# Patient Record
Sex: Female | Born: 1960 | Hispanic: Yes | Marital: Married | Smoking: Never smoker
Health system: Southern US, Community
[De-identification: ages and names within clinical notes are randomized; demographics above are authoritative.]

## PROBLEM LIST (undated history)

## (undated) DIAGNOSIS — E039 Hypothyroidism, unspecified: Secondary | ICD-10-CM

## (undated) DIAGNOSIS — T7840XA Allergy, unspecified, initial encounter: Secondary | ICD-10-CM

## (undated) DIAGNOSIS — N809 Endometriosis, unspecified: Secondary | ICD-10-CM

## (undated) DIAGNOSIS — E119 Type 2 diabetes mellitus without complications: Secondary | ICD-10-CM

## (undated) DIAGNOSIS — J45909 Unspecified asthma, uncomplicated: Secondary | ICD-10-CM

## (undated) HISTORY — DX: Unspecified asthma, uncomplicated: J45.909

## (undated) HISTORY — DX: Allergy, unspecified, initial encounter: T78.40XA

## (undated) HISTORY — DX: Endometriosis, unspecified: N80.9

## (undated) HISTORY — PX: TONSILLECTOMY: SUR1361

## (undated) HISTORY — DX: Type 2 diabetes mellitus without complications: E11.9

## (undated) HISTORY — PX: ABDOMINAL HYSTERECTOMY: SHX81

## (undated) HISTORY — DX: Hypothyroidism, unspecified: E03.9

---

## 2016-12-14 ENCOUNTER — Other Ambulatory Visit: Payer: Self-pay | Admitting: Otolaryngology

## 2016-12-14 DIAGNOSIS — H93A9 Pulsatile tinnitus, unspecified ear: Secondary | ICD-10-CM

## 2016-12-14 DIAGNOSIS — H9190 Unspecified hearing loss, unspecified ear: Secondary | ICD-10-CM

## 2016-12-25 ENCOUNTER — Ambulatory Visit
Admission: RE | Admit: 2016-12-25 | Discharge: 2016-12-25 | Disposition: A | Discharge status: Home / Self Care | Payer: Federal, State, Local not specified - PPO | Source: Ambulatory Visit | Attending: Otolaryngology | Admitting: Otolaryngology

## 2016-12-25 DIAGNOSIS — H93A9 Pulsatile tinnitus, unspecified ear: Secondary | ICD-10-CM

## 2016-12-25 DIAGNOSIS — H9190 Unspecified hearing loss, unspecified ear: Secondary | ICD-10-CM

## 2016-12-25 MED ORDER — GADOBENATE DIMEGLUMINE 529 MG/ML IV SOLN
10.0000 mL | Freq: Once | INTRAVENOUS | Status: AC | PRN
  Administered 2016-12-25: 10 mL via INTRAVENOUS

## 2017-03-02 ENCOUNTER — Encounter: Payer: Self-pay | Admitting: Physician Assistant

## 2017-03-02 ENCOUNTER — Ambulatory Visit: Payer: 59 | Admitting: Physician Assistant

## 2017-03-02 ENCOUNTER — Ambulatory Visit: Payer: Federal, State, Local not specified - PPO | Admitting: Emergency Medicine

## 2017-03-02 VITALS — BP 120/78 | HR 69 | Temp 98.0°F | Ht 62.0 in | Wt 134.8 lb

## 2017-03-02 DIAGNOSIS — J0140 Acute pansinusitis, unspecified: Secondary | ICD-10-CM | POA: Diagnosis not present

## 2017-03-02 MED ORDER — CETIRIZINE HCL 10 MG PO TABS: 10.0000 mg | ORAL_TABLET | Freq: Every day | ORAL | 11 refills | Status: DC

## 2017-03-02 MED ORDER — BENZONATATE 100 MG PO CAPS
100.0000 mg | ORAL_CAPSULE | Freq: Three times a day (TID) | ORAL | 0 refills | Status: DC | PRN
Start: 2017-03-02 — End: 2020-10-15

## 2017-03-02 MED ORDER — AMOXICILLIN-POT CLAVULANATE 875-125 MG PO TABS: 1.0000 | ORAL_TABLET | Freq: Two times a day (BID) | ORAL | 0 refills | Status: AC

## 2017-03-02 MED ORDER — FLUTICASONE PROPIONATE 50 MCG/ACT NA SUSP: 2.0000 | Freq: Every day | NASAL | 1 refills | Status: DC

## 2017-03-02 MED ORDER — GUAIFENESIN ER 1200 MG PO TB12: 1.0000 | ORAL_TABLET | Freq: Two times a day (BID) | ORAL | 1 refills | Status: DC | PRN

## 2017-03-02 NOTE — Progress Notes (Signed)
[facility] [address], [place] [phone]  Date:  03/02/2017   Name:  Karen Banks   DOB:  [birth year]   MRN:  [id number]  PCP:  Patient, No Pcp Per    History of Present Illness:  Karen Banks is a 57 y.o. female patient who presents to PCP with No chief complaint on file.    10 days of symptoms. Sore throat, then progressively worsened nasal congestion and runny nose. Cough that is non-productive worse at night. Subjective fever and chills.   Ear discomfort.   Chest pain from the coughing.   Non-smoker.  Headache.   Facial pain along her cheeks and forehead.   There are no active problems to display for this patient.   Past Medical History:  Diagnosis Date  . Allergy   . Asthma   . Diabetes mellitus without complication (HCC)     History reviewed. No pertinent surgical history.  Social History   Tobacco Use  . Smoking status: Never Smoker  . Smokeless tobacco: Never Used  Substance Use Topics  . Alcohol use: Not on file  . Drug use: Not on file    Family History  Problem Relation Age of Onset  . Cancer Father   . Hypertension Father   . Hyperlipidemia Father     Not on File  Medication list has been reviewed and updated.  No current outpatient medications on file prior to visit.   No current facility-administered medications on file prior to visit.     ROS ROS otherwise unremarkable unless listed above.  Physical Examination: BP 120/78 (BP Location: Left Arm, Patient Position: Sitting, Cuff Size: Normal)   Pulse 69   Temp 98 F (36.7 C) (Oral)   Ht 5\' 2"  (1.575 m)   Wt 134 lb 12.8 oz (61.1 kg)   SpO2 100%   BMI 24.66 kg/m  Ideal Body Weight: Weight in (lb) to have BMI = 25: 136.4  Physical Exam  Constitutional: She is oriented to person, place, and time. She appears well-developed and well-nourished. No distress.  HENT:  Head: Normocephalic and atraumatic.  Right Ear: Tympanic membrane, external  ear and ear canal normal.  Left Ear: Tympanic membrane, external ear and ear canal normal.  Nose: Mucosal edema and rhinorrhea present. Right sinus exhibits maxillary sinus tenderness. Right sinus exhibits no frontal sinus tenderness. Left sinus exhibits maxillary sinus tenderness. Left sinus exhibits no frontal sinus tenderness.  Mouth/Throat: No uvula swelling. No oropharyngeal exudate, posterior oropharyngeal edema or posterior oropharyngeal erythema.  Eyes: Conjunctivae and EOM are normal. Pupils are equal, round, and reactive to light.  Cardiovascular: Normal rate and regular rhythm. Exam reveals no gallop, no distant heart sounds and no friction rub.  No murmur heard. Pulmonary/Chest: Effort normal. No respiratory distress. She has no decreased breath sounds. She has no wheezes. She has no rhonchi.  Lymphadenopathy:       Head (right side): No submandibular, no tonsillar, no preauricular and no posterior auricular adenopathy present.       Head (left side): Preauricular adenopathy present. No submandibular, no tonsillar and no posterior auricular adenopathy present.    She has no cervical adenopathy.  Neurological: She is alert and oriented to person, place, and time.  Skin: She is not diaphoretic.  Psychiatric: She has a normal mood and affect. Her behavior is normal.     Assessment and Plan: Karen Banks is a 57 y.o. female who is here today for cc of congestion and  UR symptoms. Treating for sinusitis of bacterial etiology.   Acute non-recurrent pansinusitis - Plan: amoxicillin-clavulanate (AUGMENTIN) 875-125 MG tablet, Guaifenesin (MUCINEX MAXIMUM STRENGTH) 1200 MG TB12, fluticasone (FLONASE) 50 MCG/ACT nasal spray, cetirizine (ZYRTEC) 10 MG tablet, benzonatate (TESSALON) 100 MG capsule  Trena Platt, PA-C [facility] 1/9/20196:13 PM   There are no diagnoses linked to this encounter.  Trena Platt, PA-C [facility] 03/02/2017 6:11 PM

## 2017-03-02 NOTE — Patient Instructions (Addendum)
Please hydrate well with 64 oz of water if not more Please take medication as prescribed  Sinusitis, Adult Sinusitis is soreness and inflammation of your sinuses. Sinuses are hollow spaces in the bones around your face. They are located:  Around your eyes.  In the middle of your forehead.  Behind your nose.  In your cheekbones.  Your sinuses and nasal passages are lined with a stringy fluid (mucus). Mucus normally drains out of your sinuses. When your nasal tissues get inflamed or swollen, the mucus can get trapped or blocked so air cannot flow through your sinuses. This lets bacteria, viruses, and funguses grow, and that leads to infection. Follow these instructions at home: Medicines  Take, use, or apply over-the-counter and prescription medicines only as told by your doctor. These may include nasal sprays.  If you were prescribed an antibiotic medicine, take it as told by your doctor. Do not stop taking the antibiotic even if you start to feel better. Hydrate and Humidify  Drink enough water to keep your pee (urine) clear or pale yellow.  Use a cool mist humidifier to keep the humidity level in your home above 50%.  Breathe in steam for 10-15 minutes, 3-4 times a day or as told by your doctor. You can do this in the bathroom while a hot shower is running.  Try not to spend time in cool or dry air. Rest  Rest as much as possible.  Sleep with your head raised (elevated).  Make sure to get enough sleep each night. General instructions  Put a warm, moist washcloth on your face 3-4 times a day or as told by your doctor. This will help with discomfort.  Wash your hands often with soap and water. If there is no soap and water, use hand sanitizer.  Do not smoke. Avoid being around people who are smoking (secondhand smoke).  Keep all follow-up visits as told by your doctor. This is important. Contact a doctor if:  You have a fever.  Your symptoms get worse.  Your  symptoms do not get better within 10 days. Get help right away if:  You have a very bad headache.  You cannot stop throwing up (vomiting).  You have pain or swelling around your face or eyes.  You have trouble seeing.  You feel confused.  Your neck is stiff.  You have trouble breathing. This information is not intended to replace advice given to you by your health care provider. Make sure you discuss any questions you have with your health care provider. Document Released: 07/28/2007 Document Revised: 10/05/2015 Document Reviewed: 12/04/2014     IF you received an x-ray today, you will receive an invoice from [facility]. Please contact [facility] at [phone] with questions or concerns regarding your invoice.   IF you received labwork today, you will receive an invoice from [facility]. Please contact [facility] at [phone] with questions or concerns regarding your invoice.   Our billing staff will not be able to assist you with questions regarding bills from these companies.  You will be contacted with the lab results as soon as they are available. The fastest way to get your results is to activate your My Chart account. Instructions are located on the last page of this paperwork. If you have not heard from us regarding the results in 2 weeks, please contact this office.    [employer] Interactive Patient Education  [employer]2018 [employer]

## 2017-03-07 ENCOUNTER — Ambulatory Visit: Payer: Federal, State, Local not specified - PPO | Admitting: Neurology

## 2017-03-10 ENCOUNTER — Encounter: Payer: Self-pay | Admitting: Neurology

## 2017-03-10 ENCOUNTER — Ambulatory Visit: Payer: 59 | Admitting: Neurology

## 2017-03-10 VITALS — BP 130/79 | HR 71 | Ht 62.0 in | Wt 134.5 lb

## 2017-03-10 DIAGNOSIS — H9193 Unspecified hearing loss, bilateral: Secondary | ICD-10-CM | POA: Diagnosis not present

## 2017-03-10 NOTE — Progress Notes (Signed)
PATIENT: Karen Banks DOB: 07-May-1960  Chief Complaint  Patient presents with  . Left-sided Tinnitus    She is here with her husband, Donald Pore.  She underwent MRI brain and MRA head due to tinnitus.  Both scans were abnormal and she was referred here to further review results and next steps.  [place] four [place] ago.  . Otolaryngology    Suzanna Obey, MD     HISTORICAL  Karen Banks is a 57 year old female, seen in refer by primary care doctor Suzanna Obey, for evaluation of left side tinnitus, she is accompanied by her husband Donald Pore.  Initial evaluation is on Jan 17th 2019.  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  MRA of the brain showed normal variant, no large vessel disease.   REVIEW OF SYSTEMS: Full 14 system review of systems performed and notable only for hearing loss, ringing the ears, constipation, feeling cold, feeling hot, joint swelling, cramps, allergy, runny nose, frequent infections, memory loss, weakness, slurred speech  ALLERGIES: No Known Allergies  HOME MEDICATIONS: Current Outpatient Medications  Medication Sig Dispense Refill  . benzonatate (TESSALON) 100 MG capsule Take 1-2 capsules (100-200 mg total) by mouth 3 (three) times daily as needed for cough. 40 capsule 0  . cetirizine (ZYRTEC) 10 MG tablet Take 1 tablet (10 mg total) by mouth daily. 30 tablet 11  . fluticasone (FLONASE) 50 MCG/ACT nasal spray Place 2  sprays into both nostrils daily. 16 g 1  . Guaifenesin (MUCINEX MAXIMUM STRENGTH) 1200 MG TB12 Take 1 tablet (1,200 mg total) by mouth every 12 (twelve) hours as needed. 14 tablet 1  . levothyroxine (SYNTHROID) 150 MCG tablet TAKE 1 TABLET BY MOUTH EVERY MORNING ON AN EMPTY STOMACH    . NOVOLOG MIX 70/30 FLEXPEN (70-30) 100 UNIT/ML FlexPen INJECT 20 UNITS AT BREAKFAST AND 20 UNITS AT EVENING MEAL  6   No current facility-administered medications for this visit.     PAST MEDICAL HISTORY: Past Medical History:  Diagnosis Date  . Allergy   . Asthma   . Diabetes mellitus without complication (HCC)   . Endometriosis   . Hypothyroidism     PAST SURGICAL HISTORY: Past Surgical History:  Procedure Laterality Date  . ABDOMINAL HYSTERECTOMY    . CESAREAN SECTION      FAMILY HISTORY: Family History  Problem Relation Age of Onset  . Hypothyroidism Mother   . Hypertension Father   . Hyperlipidemia Father   . Colon cancer Father   . Diabetes Father   . Heart attack Maternal Grandfather   . Heart disease Maternal Grandfather   . Colon cancer Paternal Aunt   . Alzheimer's disease Maternal Aunt   . Alzheimer's disease Maternal Uncle     SOCIAL HISTORY:  Social History   Socioeconomic History  . Marital status: Married    Spouse name: Not on file  . Number of children: 1  . Years of education:  2 years college  . Highest education level: Not on file  Social  Needs  . Financial resource strain: Not on file  . Food insecurity - worry: Not on file  . Food insecurity - inability: Not on file  . Transportation needs - medical: Not on file  . Transportation needs - non-medical: Not on file  Occupational History  . Occupation: Diplomatic Services operational officersecretary  Tobacco Use  . Smoking status: Never Smoker  . Smokeless tobacco: Never Used  Substance and Sexual Activity  . Alcohol use: Yes    Comment: 2 glasses of wine per day  . Drug use: No  . Sexual activity: Not on file  Other Topics Concern  . Not  on file  Social History Narrative   Lives at home with husband.   Right-handed.   1 cup caffeine daily.     PHYSICAL EXAM   Vitals:   03/10/17 1412  BP: 130/79  Pulse: 71  Weight: 134 lb 8 oz (61 kg)  Height: 5\' 2"  (1.575 m)    Not recorded      Body mass index is 24.6 kg/m.  PHYSICAL EXAMNIATION:  Gen: NAD, conversant, well nourised, obese, well groomed                     Cardiovascular: Regular rate rhythm, no peripheral edema, warm, nontender. Eyes: Conjunctivae clear without exudates or hemorrhage Neck: Supple, no carotid bruits. Pulmonary: Clear to auscultation bilaterally   NEUROLOGICAL EXAM:  MENTAL STATUS: Speech:    Speech is normal; fluent and spontaneous with normal comprehension.  Cognition:     Orientation to time, place and person     Normal recent and remote memory     Normal Attention span and concentration     Normal Language, naming, repeating,spontaneous speech     Fund of knowledge   CRANIAL NERVES: CN II: Visual fields are full to confrontation. Fundoscopic exam is normal with sharp discs and no vascular changes. Pupils are round equal and briskly reactive to light. CN III, IV, VI: extraocular movement are normal. No ptosis. CN V: Facial sensation is intact to pinprick in all 3 divisions bilaterally. Corneal responses are intact.  CN VII: Face is symmetric with normal eye closure and smile. CN VIII: Hearing is normal to rubbing fingers CN IX, X: Palate elevates symmetrically. Phonation is normal. CN XI: Head turning and shoulder shrug are intact CN XII: Tongue is midline with normal movements and no atrophy.  MOTOR: There is no pronator drift of out-stretched arms. Muscle bulk and tone are normal. Muscle strength is normal.  REFLEXES: Reflexes are 2+ and symmetric at the biceps, triceps, knees, and ankles. Plantar responses are flexor.  SENSORY: Intact to light touch, pinprick, positional sensation and vibratory sensation are intact  in fingers and toes.  COORDINATION: Rapid alternating movements and fine finger movements are intact. There is no dysmetria on finger-to-nose and heel-knee-shin.    GAIT/STANCE: Posture is normal. Gait is steady with normal steps, base, arm swing, and turning. Heel and toe walking are normal. Tandem gait is normal.  Romberg is absent.   DIAGNOSTIC DATA (LABS, IMAGING, TESTING) - I reviewed patient records, labs, notes, testing and imaging myself where available.   ASSESSMENT AND PLAN  Robina Adevelyn Shaver is a 57 y.o. female   Cerebral small vessel disease,  This could be related to vascular risk factor, diabetes, thyroid disease, age,  No significant large vessel disease,  Aspirin 81 mg daily, keep well hydration, optimize diabetes control   Levert FeinsteinYijun Bernita Beckstrom, M.D. Ph.D.  [facility] [address],  [address] [place], [place] [address] Ph: [phone] Fax: [phone]  CC: Suzanna Obey, MD

## 2017-03-21 ENCOUNTER — Ambulatory Visit: Payer: Self-pay | Admitting: Neurology

## 2017-05-24 ENCOUNTER — Encounter: Payer: Self-pay | Admitting: Physician Assistant

## 2018-05-17 ENCOUNTER — Other Ambulatory Visit: Payer: Self-pay | Admitting: Internal Medicine

## 2018-05-17 DIAGNOSIS — R748 Abnormal levels of other serum enzymes: Secondary | ICD-10-CM

## 2018-06-02 ENCOUNTER — Other Ambulatory Visit: Payer: Self-pay

## 2018-06-02 ENCOUNTER — Ambulatory Visit
Admission: RE | Admit: 2018-06-02 | Discharge: 2018-06-02 | Disposition: A | Discharge status: Home / Self Care | Payer: 59 | Source: Ambulatory Visit | Attending: Internal Medicine | Admitting: Internal Medicine

## 2018-06-02 DIAGNOSIS — R748 Abnormal levels of other serum enzymes: Secondary | ICD-10-CM

## 2018-06-29 ENCOUNTER — Ambulatory Visit: Payer: Self-pay | Admitting: Surgery

## 2018-08-23 NOTE — Patient Instructions (Addendum)
YOU NEED TO HAVE A COVID 19 TEST ON_______ @_______ , THIS TEST MUST BE DONE BEFORE SURGERY, [facility]. ONCE YOUR COVID TEST IS COMPLETED, PLEASE BEGIN THE QUARANTINE INSTRUCTIONS AS OUTLINED IN YOUR HANDOUT.                Karen Banks    Your procedure is scheduled on: 09-01-2018  Report to [facility] Main  Entrance  Report to admitting at 730  AM      Call this number if you have problems the morning of surgery [phone]    Remember: Do not eat food or drink liquids :After Midnight. BRUSH YOUR TEETH MORNING OF SURGERY AND RINSE YOUR MOUTH OUT, NO CHEWING GUM CANDY OR MINTS.     Take these medicines the morning of surgery with A SIP OF WATER:   DO NOT TAKE ANY DIABETIC MEDICATIONS DAY OF YOUR SURGERY        How to Manage Your Diabetes Before and After Surgery  Why is it important to control my blood sugar before and after surgery? . Improving blood sugar levels before and after surgery helps healing and can limit problems. . A way of improving blood sugar control is eating a healthy diet by: o  Eating less sugar and carbohydrates o  Increasing activity/exercise o  Talking with your doctor about reaching your blood sugar goals . High blood sugars (greater than 180 mg/dL) can raise your risk of infections and slow your recovery, so you will need to focus on controlling your diabetes during the weeks before surgery. . Make sure that the doctor who takes care of your diabetes knows about your planned surgery including the date and location.  How do I manage my blood sugar before surgery? . Check your blood sugar at least 4 times a day, starting 2 days before surgery, to make sure that the level is not too high or low. o Check your blood sugar the morning of your surgery when you wake up and every 2 hours until you get to the Short Stay unit. . If your blood sugar is less than 70 mg/dL, you will need to treat for low blood  sugar: o Do not take insulin. o Treat a low blood sugar (less than 70 mg/dL) with  cup of clear juice (cranberry or apple), 4 glucose tablets, OR glucose gel. o Recheck blood sugar in 15 minutes after treatment (to make sure it is greater than 70 mg/dL). If your blood sugar is not greater than 70 mg/dL on recheck, call [phone] for further instructions. . Report your blood sugar to the short stay nurse when you get to Short Stay.  . If you are admitted to the hospital after surgery: o Your blood sugar will be checked by the staff and you will probably be given insulin after surgery (instead of oral diabetes medicines) to make sure you have good blood sugar levels. o The goal for blood sugar control after surgery is 80-180 mg/dL.   WHAT DO I DO ABOUT MY DIABETES MEDICATION?  Marland Kitchen. Do not take oral diabetes medicines (pills) the morning of surgery.  . THE NIGHT BEFORE SURGERY, take     units of       insulin.       . THE MORNING OF SURGERY, take   units of         insulin.  . The day of surgery, do not take other diabetes injectables, including Byetta (exenatide),  Bydureon (exenatide ER), Victoza (liraglutide), or Trulicity (dulaglutide).  . If your CBG is greater than 220 mg/dL, you may take  of your sliding scale  . (correction) dose of insulin.     Reviewed and Endorsed by [facility] Patient Education Committee, August 2015                        You may not have any metal on your body including hair pins and              piercings  Do not wear jewelry, make-up, lotions, powders or perfumes, deodorant             Do not wear nail polish.  Do not shave  48 hours prior to surgery.              Men may shave face and neck.   Do not bring valuables to the hospital. [place].  Contacts, dentures or bridgework may not be worn into surgery.  Leave suitcase in the car. After surgery it may be brought to your room.     Patients  discharged the day of surgery will not be allowed to drive home. IF YOU ARE HAVING SURGERY AND GOING HOME THE SAME DAY, YOU MUST HAVE AN ADULT TO DRIVE YOU HOME AND BE WITH YOU FOR 24 HOURS. YOU MAY GO HOME BY TAXI OR UBER OR ORTHERWISE, BUT AN ADULT MUST ACCOMPANY YOU HOME AND STAY WITH YOU FOR 24 HOURS.  Name and phone number of your driver:  Special Instructions: N/A              Please read over the following fact sheets you were given: _____________________________________________________________________             [facility] - Preparing for Surgery Before surgery, you can play an important role.  Because skin is not sterile, your skin needs to be as free of germs as possible.  You can reduce the number of germs on your skin by washing with CHG (chlorahexidine gluconate) soap before surgery.  CHG is an antiseptic cleaner which kills germs and bonds with the skin to continue killing germs even after washing. Please DO NOT use if you have an allergy to CHG or antibacterial soaps.  If your skin becomes reddened/irritated stop using the CHG and inform your nurse when you arrive at Short Stay. Do not shave (including legs and underarms) for at least 48 hours prior to the first CHG shower.  You may shave your face/neck. Please follow these instructions carefully:  1.  Shower with CHG Soap the night before surgery and the  morning of Surgery.  2.  If you choose to wash your hair, wash your hair first as usual with your  normal  shampoo.  3.  After you shampoo, rinse your hair and body thoroughly to remove the  shampoo.                           4.  Use CHG as you would any other liquid soap.  You can apply chg directly  to the skin and wash                       Gently with a scrungie or clean washcloth.  5.  Apply  the CHG Soap to your body ONLY FROM THE NECK DOWN.   Do not use on face/ open                           Wound or open sores. Avoid contact with eyes, ears mouth and genitals (private  parts).                       Wash face,  Genitals (private parts) with your normal soap.             6.  Wash thoroughly, paying special attention to the area where your surgery  will be performed.  7.  Thoroughly rinse your body with warm water from the neck down.  8.  DO NOT shower/wash with your normal soap after using and rinsing off  the CHG Soap.                9.  Pat yourself dry with a clean towel.            10.  Wear clean pajamas.            11.  Place clean sheets on your bed the night of your first shower and do not  sleep with pets. Day of Surgery : Do not apply any lotions/deodorants the morning of surgery.  Please wear clean clothes to the hospital/surgery center.  FAILURE TO FOLLOW THESE INSTRUCTIONS MAY RESULT IN THE CANCELLATION OF YOUR SURGERY PATIENT SIGNATURE_________________________________  NURSE SIGNATURE__________________________________  ________________________________________________________________________

## 2018-08-24 ENCOUNTER — Encounter (HOSPITAL_COMMUNITY): Payer: Self-pay

## 2018-08-24 ENCOUNTER — Encounter (HOSPITAL_COMMUNITY): Admission: RE | Admit: 2018-08-24 | Payer: 59 | Source: Ambulatory Visit

## 2018-08-24 ENCOUNTER — Encounter (INDEPENDENT_AMBULATORY_CARE_PROVIDER_SITE_OTHER): Payer: Self-pay

## 2018-08-24 ENCOUNTER — Encounter (HOSPITAL_COMMUNITY)
Admission: RE | Admit: 2018-08-24 | Discharge: 2018-08-24 | Disposition: A | Discharge status: Home / Self Care | Payer: 59 | Source: Ambulatory Visit | Attending: Surgery | Admitting: Surgery

## 2018-08-24 ENCOUNTER — Other Ambulatory Visit: Payer: Self-pay

## 2018-08-24 DIAGNOSIS — R945 Abnormal results of liver function studies: Secondary | ICD-10-CM | POA: Diagnosis not present

## 2018-08-24 DIAGNOSIS — Z01818 Encounter for other preprocedural examination: Secondary | ICD-10-CM | POA: Insufficient documentation

## 2018-08-24 DIAGNOSIS — K8020 Calculus of gallbladder without cholecystitis without obstruction: Secondary | ICD-10-CM | POA: Insufficient documentation

## 2018-08-24 LAB — CBC
HCT: 43.5 % (ref 36.0–46.0)
Hemoglobin: 14.4 g/dL (ref 12.0–15.0)
MCH: 33.1 pg (ref 26.0–34.0)
MCHC: 33.1 g/dL (ref 30.0–36.0)
MCV: 100 fL (ref 80.0–100.0)
Platelets: 253 K/uL (ref 150–400)
RBC: 4.35 MIL/uL (ref 3.87–5.11)
RDW: 11.5 % (ref 11.5–15.5)
WBC: 4.5 K/uL (ref 4.0–10.5)
nRBC: 0 % (ref 0.0–0.2)

## 2018-08-24 LAB — BASIC METABOLIC PANEL
Anion gap: 10 (ref 5–15)
BUN: 13 mg/dL (ref 6–20)
CO2: 25 mmol/L (ref 22–32)
Calcium: 9.1 mg/dL (ref 8.9–10.3)
Chloride: 102 mmol/L (ref 98–111)
Creatinine, Ser: 0.46 mg/dL (ref 0.44–1.00)
GFR calc Af Amer: >60 mL/min (ref >60)
GFR calc non Af Amer: >60 mL/min (ref >60)
Glucose, Bld: 175 mg/dL — ABNORMAL HIGH (ref 70–99)
Potassium: 4 mmol/L (ref 3.5–5.1)
Sodium: 137 mmol/L (ref 135–145)

## 2018-08-24 LAB — HEMOGLOBIN A1C
Hgb A1c MFr Bld: 8.5 % — ABNORMAL HIGH (ref 4.8–5.6)
Mean Plasma Glucose: 197.25 mg/dL

## 2018-08-24 LAB — GLUCOSE, CAPILLARY: Glucose-Capillary: 218 mg/dL — ABNORMAL HIGH (ref 70–99)

## 2018-08-24 NOTE — Patient Instructions (Signed)
YOU NEED TO HAVE A COVID 19 TEST ON 08-29-18  @ 3:30 PM, THIS TEST MUST BE DONE BEFORE SURGERY, COME TO [facility] ENTRANCE. ONCE YOUR COVID TEST IS COMPLETED, PLEASE BEGIN THE QUARANTINE INSTRUCTIONS AS OUTLINED IN YOUR HANDOUT.                Cadyn Rodger  08/24/2018   Your procedure is scheduled on:  09-01-18    Report to [facility] Main  Entrance    Report to Admitting at 7:30AM    Call this number if you have problems the morning of surgery [phone]    Remember: Do not eat food or drink liquids :After Midnight.    Take these medicines the morning of surgery with A SIP OF WATER: Levothyroxine (Synthroid). You may also bring and use your nasal spray as needed.    DO NOT TAKE ANY DIABETIC MEDICATIONS DAY OF YOUR SURGERY                               You may not have any metal on your body including hair pins and              piercings     Do not wear jewelry, make-up, lotions, powders or perfumes, deodorant             Do not wear nail polish.  Do not shave  48 hours prior to surgery.  .   Do not bring valuables to the hospital. [place].  Contacts, dentures or bridgework may not be worn into surgery.   Special Instructions: N/A              Please read over the following fact sheets you were given: _____________________________________________________________________             How to Manage Your Diabetes Before and After Surgery  Why is it important to control my blood sugar before and after surgery? . Improving blood sugar levels before and after surgery helps healing and can limit problems. . A way of improving blood sugar control is eating a healthy diet by: o  Eating less sugar and carbohydrates o  Increasing activity/exercise o  Talking with your doctor about reaching your blood sugar goals . High blood sugars (greater than 180 mg/dL) can raise your risk of infections  and slow your recovery, so you will need to focus on controlling your diabetes during the weeks before surgery. . Make sure that the doctor who takes care of your diabetes knows about your planned surgery including the date and location.  How do I manage my blood sugar before surgery? . Check your blood sugar at least 4 times a day, starting 2 days before surgery, to make sure that the level is not too high or low. o Check your blood sugar the morning of your surgery when you wake up and every 2 hours until you get to the Short Stay unit. . If your blood sugar is less than 70 mg/dL, you will need to treat for low blood sugar: o Do not take insulin. o Treat a low blood sugar (less than 70 mg/dL) with  cup of clear juice (cranberry or apple), 4 glucose tablets, OR glucose gel. o Recheck blood sugar in 15 minutes after treatment (to make sure it  is greater than 70 mg/dL). If your blood sugar is not greater than 70 mg/dL on recheck, call [phone] for further instructions. . Report your blood sugar to the short stay nurse when you get to Short Stay.  . If you are admitted to the hospital after surgery: o Your blood sugar will be checked by the staff and you will probably be given insulin after surgery (instead of oral diabetes medicines) to make sure you have good blood sugar levels. o The goal for blood sugar control after surgery is 80-180 mg/dL.   WHAT DO I DO ABOUT MY DIABETES MEDICATION?  Marland Kitchen. Do not take oral diabetes medicines (pills) the morning of surgery.  . THE DAY BEFORE SURGERY, take your usual morning dose of 18-20 units of your Novolog 70/30 insulin. However, take only 12-14 units of Novolog 70/30 in the PM.        . The day of surgery, do not take other diabetes injectables, including Byetta (exenatide), Bydureon (exenatide ER), Victoza (liraglutide), or Trulicity (dulaglutide).    [place] - Preparing for Surgery Before surgery, you can play an important role.  Because  skin is not sterile, your skin needs to be as free of germs as possible.  You can reduce the number of germs on your skin by washing with CHG (chlorahexidine gluconate) soap before surgery.  CHG is an antiseptic cleaner which kills germs and bonds with the skin to continue killing germs even after washing. Please DO NOT use if you have an allergy to CHG or antibacterial soaps.  If your skin becomes reddened/irritated stop using the CHG and inform your nurse when you arrive at Short Stay. Do not shave (including legs and underarms) for at least 48 hours prior to the first CHG shower.  You may shave your face/neck. Please follow these instructions carefully:  1.  Shower with CHG Soap the night before surgery and the  morning of Surgery.  2.  If you choose to wash your hair, wash your hair first as usual with your  normal  shampoo.  3.  After you shampoo, rinse your hair and body thoroughly to remove the  shampoo.                           4.  Use CHG as you would any other liquid soap.  You can apply chg directly  to the skin and wash                       Gently with a scrungie or clean washcloth.  5.  Apply the CHG Soap to your body ONLY FROM THE NECK DOWN.   Do not use on face/ open                           Wound or open sores. Avoid contact with eyes, ears mouth and genitals (private parts).                       Wash face,  Genitals (private parts) with your normal soap.             6.  Wash thoroughly, paying special attention to the area where your surgery  will be performed.  7.  Thoroughly rinse your body with warm water from the neck down.  8.  DO NOT shower/wash with your normal soap after using and  rinsing off  the CHG Soap.                9.  Pat yourself dry with a clean towel.            10.  Wear clean pajamas.            11.  Place clean sheets on your bed the night of your first shower and do not  sleep with pets. Day of Surgery : Do not apply any lotions/deodorants the morning of  surgery.  Please wear clean clothes to the hospital/surgery center.  FAILURE TO FOLLOW THESE INSTRUCTIONS MAY RESULT IN THE CANCELLATION OF YOUR SURGERY PATIENT SIGNATURE_________________________________  NURSE SIGNATURE__________________________________  ________________________________________________________________________

## 2018-08-29 ENCOUNTER — Other Ambulatory Visit (HOSPITAL_COMMUNITY)
Admission: RE | Admit: 2018-08-29 | Discharge: 2018-08-29 | Disposition: A | Discharge status: Home / Self Care | Payer: 59 | Source: Ambulatory Visit | Attending: Surgery | Admitting: Surgery

## 2018-08-29 DIAGNOSIS — Z1159 Encounter for screening for other viral diseases: Secondary | ICD-10-CM | POA: Diagnosis not present

## 2018-08-29 DIAGNOSIS — Z01812 Encounter for preprocedural laboratory examination: Secondary | ICD-10-CM | POA: Insufficient documentation

## 2018-08-29 LAB — SARS CORONAVIRUS 2 (TAT 6-24 HRS): SARS Coronavirus 2: NEGATIVE

## 2018-08-30 ENCOUNTER — Encounter (HOSPITAL_COMMUNITY): Payer: Self-pay | Admitting: Surgery

## 2018-08-30 DIAGNOSIS — K8010 Calculus of gallbladder with chronic cholecystitis without obstruction: Secondary | ICD-10-CM | POA: Diagnosis present

## 2018-08-30 NOTE — H&P (Signed)
General Surgery [facility]  Karen Banks DOB: [birth year] Married / Language: English / Race: Refused to Report/Unreported Female   History of Present Illness   The patient is a 58 year old female who presents for evaluation of gall stones.  CHIEF COMPLAINT: cholelithiasis, elevated LFT's, Type I DM  Patient is referred by Dr. Dagmar Hait for surgical evaluation and management of cholelithiasis and elevated liver function test in the setting of type 1 diabetes mellitus. Patient was discovered on routine laboratory studies to have elevated liver function test. Additional studies were negative for hepatitis. She underwent an ultrasound examination on June 02, 2018 which did show cholelithiasis without evidence of cholecystitis. There was no biliary dilatation. There were changes of fatty liver. Patient was diagnosed with type 1 diabetes mellitus approximately 3 years ago. Patient denies any signs or symptoms of biliary colic. She denies jaundice or acholic stools. She denies any history of hepatitis or pancreatitis. There is a family history of gallbladder disease and the patient's parents. Her mother underwent cholecystectomy at a young age. Patient has had prior abdominal hysterectomy and cesarean section. She has had no upper abdominal surgery. She presents today accompanied by her husband to discuss cholecystectomy for management of cholelithiasis, abnormal liver function test, in the setting of type 1 diabetes.   Past Surgical History Hysterectomy (not due to cancer) - Partial  Tonsillectomy   Diagnostic Studies History Colonoscopy  1-5 years ago Mammogram  1-3 years ago Pap Smear  1-5 years ago  Allergies No Known Drug Allergies  [06/29/2018]: Allergies Reconciled   Medication History  Synthroid (112MCG Tablet, Oral) Active. NovoLOG Mix 70/30 FlexPen ((70-30) 100UNIT/ML Susp Pen-inj, Subcutaneous) Active. Medications  Reconciled  Social History Alcohol use  Moderate alcohol use. Caffeine use  Coffee. No drug use  Tobacco use  Never smoker.  Family History Alcohol Abuse  Father. Arthritis  Mother. Colon Cancer  Father. Diabetes Mellitus  Father. Heart Disease  Mother. Hypertension  Father. Kidney Disease  Father. Respiratory Condition  Mother.  Pregnancy / Birth History Age at menarche  89 years. Gravida  1 Length (months) of breastfeeding  3-6 Maternal age  105-25 Para  1  Other Problems Asthma  Diabetes Mellitus  Thyroid Disease   Review of Systems General Present- Weight Gain. Not Present- Appetite Loss, Chills, Fatigue, Fever, Night Sweats and Weight Loss. Skin Present- Dryness. Not Present- Change in Wart/Mole, Hives, Jaundice, New Lesions, Non-Healing Wounds, Rash and Ulcer. HEENT Present- Hearing Loss, Ringing in the Ears, Seasonal Allergies and Wears glasses/contact lenses. Not Present- Earache, Hoarseness, Nose Bleed, Oral Ulcers, Sinus Pain, Sore Throat, Visual Disturbances and Yellow Eyes. Respiratory Present- Snoring. Not Present- Bloody sputum, Chronic Cough, Difficulty Breathing and Wheezing. Breast Not Present- Breast Mass, Breast Pain, Nipple Discharge and Skin Changes. Cardiovascular Not Present- Chest Pain, Difficulty Breathing Lying Down, Leg Cramps, Palpitations, Rapid Heart Rate, Shortness of Breath and Swelling of Extremities. Gastrointestinal Present- Bloating, Constipation, Excessive gas and Nausea. Not Present- Abdominal Pain, Bloody Stool, Change in Bowel Habits, Chronic diarrhea, Difficulty Swallowing, Gets full quickly at meals, Hemorrhoids, Indigestion, Rectal Pain and Vomiting. Female Genitourinary Not Present- Frequency, Nocturia, Painful Urination, Pelvic Pain and Urgency. Musculoskeletal Present- Swelling of Extremities. Not Present- Back Pain, Joint Pain, Joint Stiffness, Muscle Pain and Muscle Weakness. Neurological Present- Weakness.  Not Present- Decreased Memory, Fainting, Headaches, Numbness, Seizures, Tingling, Tremor and Trouble walking. Psychiatric Not Present- Anxiety, Bipolar, Change in Sleep Pattern, Depression, Fearful and Frequent crying. Endocrine Present- Hair Changes and  Heat Intolerance. Not Present- Cold Intolerance, Excessive Hunger, Hot flashes and New Diabetes. Hematology Present- Easy Bruising and Excessive bleeding. Not Present- Blood Thinners, Gland problems, HIV and Persistent Infections.  Vitals Weight: 150.13 lb Height: 61in Body Surface Area: 1.67 m Body Mass Index: 28.37 kg/m  Temp.: 98.41F (Oral)  Pulse: 91 (Regular)  BP: 104/60(Sitting, Left Arm, Standard)  Physical Exam   See vital signs recorded above  GENERAL APPEARANCE Development: normal Nutritional status: normal Gross deformities: none  SKIN Rash, lesions, ulcers: none Induration, erythema: none Nodules: none palpable  EYES Conjunctiva and lids: normal Pupils: equal and reactive Iris: normal bilaterally  EARS, NOSE, MOUTH, THROAT External ears: no lesion or deformity External nose: no lesion or deformity Hearing: grossly normal Lips: no lesion or deformity Dentition: normal for age Oral mucosa: moist  NECK Symmetric: yes Trachea: midline Thyroid: no palpable nodules in the thyroid bed  CHEST Respiratory effort: normal Retraction or accessory muscle use: no Breath sounds: normal bilaterally Rales, rhonchi, wheeze: none  CARDIOVASCULAR Auscultation: regular rhythm, normal rate Murmurs: none Pulses: carotid and radial pulse 2+ palpable Lower extremity edema: none Lower extremity varicosities: none  ABDOMEN Distension: none Masses: none palpable Tenderness: none Hepatosplenomegaly: not present Hernia: not present  MUSCULOSKELETAL Station and gait: normal Digits and nails: no clubbing or cyanosis Muscle strength: grossly normal all extremities Range of motion: grossly normal all  extremities Deformity: none  LYMPHATIC Cervical: none palpable Supraclavicular: none palpable  PSYCHIATRIC Oriented to person, place, and time: yes Mood and affect: normal for situation Judgment and insight: appropriate for situation    Assessment & Plan  CHOLELITHIASIS WITHOUT CHOLECYSTITIS (K80.20) ABNORMAL LIVER ENZYMES (R74.8) DIABETES, TYPE I (E10.9)   Pt Education - Pamphlet Given - Laparoscopic Gallbladder Surgery: discussed with patient and provided information.  Patient is referred by her endocrinologist for consideration for cholecystectomy due to the presence of cholelithiasis, abnormal liver enzymes, and a history of type 1 diabetes. She is accompanied by her husband. They are provided with written literature on gallbladder surgery to review at home.  Patient has not been symptomatic other than her abnormal liver enzymes. She has had further evaluation including screening for hepatitis. Ultrasound does show steatosis in the liver which may explain her abnormal liver enzymes. However, the ultrasound does show small gallstones and she could have transient choledocholithiasis. At present, she is asymptomatic and I do not think that MRCP would change our management. Also she has type 1 diabetes and in my experience these patients do poorly with cholelithiasis. Therefore I have recommended proceeding with laparoscopic cholecystectomy with intraoperative cholangiography. We discussed the procedure and I provided them with written literature describing the procedure in Spanish. We discussed the preoperative assessment and the hospital stay to be anticipated. We discussed her recovery and return to normal activity. She understands and wishes to proceed with surgery in the near future.  The risks and benefits of the procedure have been discussed at length with the patient. The patient understands the proposed procedure, potential alternative treatments, and the course of  recovery to be expected. All of the patient's questions have been answered at this time. The patient wishes to proceed with surgery.   Darnell Levelodd Stefana Lodico, MD [facility] Office: [phone]

## 2018-09-01 ENCOUNTER — Ambulatory Visit (HOSPITAL_COMMUNITY): Payer: 59

## 2018-09-01 ENCOUNTER — Encounter (HOSPITAL_COMMUNITY): Payer: Self-pay | Admitting: Anesthesiology

## 2018-09-01 ENCOUNTER — Other Ambulatory Visit: Payer: Self-pay

## 2018-09-01 ENCOUNTER — Observation Stay (HOSPITAL_COMMUNITY)
Admission: RE | Admit: 2018-09-01 | Discharge: 2018-09-01 | Disposition: A | Discharge status: Home / Self Care | Payer: 59 | Source: Other Acute Inpatient Hospital | Attending: Surgery | Admitting: Surgery

## 2018-09-01 ENCOUNTER — Ambulatory Visit (HOSPITAL_COMMUNITY): Payer: 59 | Admitting: Physician Assistant

## 2018-09-01 ENCOUNTER — Encounter (HOSPITAL_COMMUNITY)
Admission: RE | Disposition: A | Discharge status: Home / Self Care | Payer: Self-pay | Source: Other Acute Inpatient Hospital | Attending: Surgery

## 2018-09-01 ENCOUNTER — Ambulatory Visit (HOSPITAL_COMMUNITY): Payer: 59 | Admitting: Anesthesiology

## 2018-09-01 DIAGNOSIS — Z833 Family history of diabetes mellitus: Secondary | ICD-10-CM | POA: Diagnosis not present

## 2018-09-01 DIAGNOSIS — Z419 Encounter for procedure for purposes other than remedying health state, unspecified: Secondary | ICD-10-CM

## 2018-09-01 DIAGNOSIS — Z90710 Acquired absence of both cervix and uterus: Secondary | ICD-10-CM | POA: Insufficient documentation

## 2018-09-01 DIAGNOSIS — J45909 Unspecified asthma, uncomplicated: Secondary | ICD-10-CM | POA: Insufficient documentation

## 2018-09-01 DIAGNOSIS — Z8249 Family history of ischemic heart disease and other diseases of the circulatory system: Secondary | ICD-10-CM | POA: Insufficient documentation

## 2018-09-01 DIAGNOSIS — K8020 Calculus of gallbladder without cholecystitis without obstruction: Secondary | ICD-10-CM | POA: Diagnosis present

## 2018-09-01 DIAGNOSIS — Z79890 Hormone replacement therapy (postmenopausal): Secondary | ICD-10-CM | POA: Diagnosis not present

## 2018-09-01 DIAGNOSIS — E109 Type 1 diabetes mellitus without complications: Secondary | ICD-10-CM | POA: Diagnosis not present

## 2018-09-01 DIAGNOSIS — K760 Fatty (change of) liver, not elsewhere classified: Secondary | ICD-10-CM | POA: Insufficient documentation

## 2018-09-01 DIAGNOSIS — Z794 Long term (current) use of insulin: Secondary | ICD-10-CM | POA: Diagnosis not present

## 2018-09-01 DIAGNOSIS — R748 Abnormal levels of other serum enzymes: Secondary | ICD-10-CM | POA: Diagnosis present

## 2018-09-01 DIAGNOSIS — K660 Peritoneal adhesions (postprocedural) (postinfection): Secondary | ICD-10-CM | POA: Insufficient documentation

## 2018-09-01 DIAGNOSIS — K8010 Calculus of gallbladder with chronic cholecystitis without obstruction: Principal | ICD-10-CM

## 2018-09-01 DIAGNOSIS — R7989 Other specified abnormal findings of blood chemistry: Secondary | ICD-10-CM | POA: Diagnosis present

## 2018-09-01 DIAGNOSIS — E039 Hypothyroidism, unspecified: Secondary | ICD-10-CM | POA: Diagnosis not present

## 2018-09-01 HISTORY — PX: CHOLECYSTECTOMY: SHX55

## 2018-09-01 LAB — GLUCOSE, CAPILLARY
Glucose-Capillary: 170 mg/dL — ABNORMAL HIGH (ref 70–99)
Glucose-Capillary: 301 mg/dL — ABNORMAL HIGH (ref 70–99)
Glucose-Capillary: 374 mg/dL — ABNORMAL HIGH (ref 70–99)

## 2018-09-01 SURGERY — LAPAROSCOPIC CHOLECYSTECTOMY WITH INTRAOPERATIVE CHOLANGIOGRAM
Anesthesia: General

## 2018-09-01 MED ORDER — CHLORHEXIDINE GLUCONATE CLOTH 2 % EX PADS: 6.0000 | MEDICATED_PAD | Freq: Once | CUTANEOUS | Status: DC

## 2018-09-01 MED ORDER — FENTANYL CITRATE (PF) 100 MCG/2ML IJ SOLN
INTRAMUSCULAR | Status: DC | PRN
  Administered 2018-09-01: 25 ug via INTRAVENOUS
  Administered 2018-09-01: 50 ug via INTRAVENOUS
  Administered 2018-09-01 (×2): 25 ug via INTRAVENOUS
  Administered 2018-09-01: 75 ug via INTRAVENOUS

## 2018-09-01 MED ORDER — FENTANYL CITRATE (PF) 100 MCG/2ML IJ SOLN
INTRAMUSCULAR | Status: AC
  Administered 2018-09-01: 50 ug via INTRAVENOUS
  Filled 2018-09-01: qty 2

## 2018-09-01 MED ORDER — FENTANYL CITRATE (PF) 100 MCG/2ML IJ SOLN
INTRAMUSCULAR | Status: AC
  Filled 2018-09-01: qty 2

## 2018-09-01 MED ORDER — MEPERIDINE HCL 50 MG/ML IJ SOLN: 6.2500 mg | INTRAMUSCULAR | Status: DC | PRN

## 2018-09-01 MED ORDER — ROCURONIUM BROMIDE 10 MG/ML (PF) SYRINGE
PREFILLED_SYRINGE | INTRAVENOUS | Status: AC
  Filled 2018-09-01: qty 30

## 2018-09-01 MED ORDER — BUPIVACAINE-EPINEPHRINE 0.5% -1:200000 IJ SOLN
INTRAMUSCULAR | Status: DC | PRN
  Administered 2018-09-01: 30 mL

## 2018-09-01 MED ORDER — OXYCODONE HCL 5 MG/5ML PO SOLN: 5.0000 mg | Freq: Once | ORAL | Status: DC | PRN

## 2018-09-01 MED ORDER — LIDOCAINE 2% (20 MG/ML) 5 ML SYRINGE
INTRAMUSCULAR | Status: DC | PRN
  Administered 2018-09-01: 80 mg via INTRAVENOUS

## 2018-09-01 MED ORDER — LIDOCAINE 2% (20 MG/ML) 5 ML SYRINGE
INTRAMUSCULAR | Status: AC
  Filled 2018-09-01: qty 15

## 2018-09-01 MED ORDER — MIDAZOLAM HCL 5 MG/5ML IJ SOLN
INTRAMUSCULAR | Status: DC | PRN
  Administered 2018-09-01: 2 mg via INTRAVENOUS

## 2018-09-01 MED ORDER — ONDANSETRON HCL 4 MG/2ML IJ SOLN: 4.0000 mg | Freq: Four times a day (QID) | INTRAMUSCULAR | Status: DC | PRN

## 2018-09-01 MED ORDER — HYDROMORPHONE HCL 1 MG/ML IJ SOLN: 1.0000 mg | INTRAMUSCULAR | Status: DC | PRN

## 2018-09-01 MED ORDER — ROCURONIUM BROMIDE 10 MG/ML (PF) SYRINGE
PREFILLED_SYRINGE | INTRAVENOUS | Status: DC | PRN
  Administered 2018-09-01: 50 mg via INTRAVENOUS

## 2018-09-01 MED ORDER — PROPOFOL 10 MG/ML IV BOLUS
INTRAVENOUS | Status: AC
  Filled 2018-09-01: qty 40

## 2018-09-01 MED ORDER — ONDANSETRON HCL 4 MG/2ML IJ SOLN
INTRAMUSCULAR | Status: DC | PRN
  Administered 2018-09-01: 4 mg via INTRAVENOUS

## 2018-09-01 MED ORDER — BUPIVACAINE-EPINEPHRINE 0.5% -1:200000 IJ SOLN
INTRAMUSCULAR | Status: AC
  Filled 2018-09-01: qty 1

## 2018-09-01 MED ORDER — INSULIN ASPART PROT & ASPART (70-30 MIX) 100 UNIT/ML ~~LOC~~ SUSP
18.0000 [IU] | Freq: Every day | SUBCUTANEOUS | Status: DC
  Filled 2018-09-01: qty 10

## 2018-09-01 MED ORDER — MIDAZOLAM HCL 2 MG/2ML IJ SOLN
INTRAMUSCULAR | Status: AC
  Filled 2018-09-01: qty 2

## 2018-09-01 MED ORDER — TRAMADOL HCL 50 MG PO TABS: 50.0000 mg | ORAL_TABLET | Freq: Four times a day (QID) | ORAL | Status: DC | PRN

## 2018-09-01 MED ORDER — ACETAMINOPHEN 325 MG PO TABS: 325.0000 mg | ORAL_TABLET | ORAL | Status: DC | PRN

## 2018-09-01 MED ORDER — INSULIN ASPART PROT & ASPART (70-30 MIX) 100 UNIT/ML PEN: 18.0000 [IU] | PEN_INJECTOR | Freq: Two times a day (BID) | SUBCUTANEOUS | Status: DC

## 2018-09-01 MED ORDER — DEXAMETHASONE SODIUM PHOSPHATE 10 MG/ML IJ SOLN
INTRAMUSCULAR | Status: DC | PRN
  Administered 2018-09-01: 10 mg via INTRAVENOUS

## 2018-09-01 MED ORDER — ACETAMINOPHEN 160 MG/5ML PO SOLN: 325.0000 mg | ORAL | Status: DC | PRN

## 2018-09-01 MED ORDER — SODIUM CHLORIDE 0.9 % IV SOLN
INTRAVENOUS | Status: DC | PRN
  Administered 2018-09-01: 8 mL

## 2018-09-01 MED ORDER — HYDROCODONE-ACETAMINOPHEN 5-325 MG PO TABS: 1.0000 | ORAL_TABLET | ORAL | 0 refills | Status: DC | PRN

## 2018-09-01 MED ORDER — RINGERS IRRIGATION IR SOLN
Status: DC | PRN
  Administered 2018-09-01: 1000 mL

## 2018-09-01 MED ORDER — INSULIN ASPART 100 UNIT/ML ~~LOC~~ SOLN: 0.0000 [IU] | Freq: Three times a day (TID) | SUBCUTANEOUS | Status: DC

## 2018-09-01 MED ORDER — ONDANSETRON HCL 4 MG/2ML IJ SOLN: 4.0000 mg | Freq: Once | INTRAMUSCULAR | Status: DC | PRN

## 2018-09-01 MED ORDER — ACETAMINOPHEN 325 MG PO TABS: 650.0000 mg | ORAL_TABLET | Freq: Four times a day (QID) | ORAL | Status: DC | PRN

## 2018-09-01 MED ORDER — POTASSIUM CHLORIDE IN NACL 20-0.9 MEQ/L-% IV SOLN
INTRAVENOUS | Status: DC
  Filled 2018-09-01: qty 1000

## 2018-09-01 MED ORDER — SUGAMMADEX SODIUM 200 MG/2ML IV SOLN
INTRAVENOUS | Status: DC | PRN
  Administered 2018-09-01: 150 mg via INTRAVENOUS

## 2018-09-01 MED ORDER — PROPOFOL 10 MG/ML IV BOLUS
INTRAVENOUS | Status: DC | PRN
  Administered 2018-09-01: 150 mg via INTRAVENOUS

## 2018-09-01 MED ORDER — 0.9 % SODIUM CHLORIDE (POUR BTL) OPTIME
TOPICAL | Status: DC | PRN
  Administered 2018-09-01: 1000 mL

## 2018-09-01 MED ORDER — INSULIN ASPART PROT & ASPART (70-30 MIX) 100 UNIT/ML ~~LOC~~ SUSP
18.0000 [IU] | Freq: Once | SUBCUTANEOUS | Status: AC
  Administered 2018-09-01: 18 [IU] via SUBCUTANEOUS
  Filled 2018-09-01: qty 10

## 2018-09-01 MED ORDER — CEFAZOLIN SODIUM-DEXTROSE 2-4 GM/100ML-% IV SOLN
2.0000 g | INTRAVENOUS | Status: AC
  Administered 2018-09-01: 2 g via INTRAVENOUS
  Filled 2018-09-01: qty 100

## 2018-09-01 MED ORDER — HYDROCODONE-ACETAMINOPHEN 5-325 MG PO TABS: 1.0000 | ORAL_TABLET | ORAL | Status: DC | PRN

## 2018-09-01 MED ORDER — FENTANYL CITRATE (PF) 100 MCG/2ML IJ SOLN
25.0000 ug | INTRAMUSCULAR | Status: DC | PRN
  Administered 2018-09-01 (×3): 50 ug via INTRAVENOUS

## 2018-09-01 MED ORDER — LEVOTHYROXINE SODIUM 112 MCG PO TABS: 112.0000 ug | ORAL_TABLET | Freq: Every day | ORAL | Status: DC

## 2018-09-01 MED ORDER — LACTATED RINGERS IV SOLN
INTRAVENOUS | Status: DC
  Administered 2018-09-01: 08:00:00 via INTRAVENOUS

## 2018-09-01 MED ORDER — INSULIN ASPART PROT & ASPART (70-30 MIX) 100 UNIT/ML ~~LOC~~ SUSP
20.0000 [IU] | Freq: Every day | SUBCUTANEOUS | Status: DC
  Filled 2018-09-01: qty 10

## 2018-09-01 MED ORDER — ONDANSETRON 4 MG PO TBDP: 4.0000 mg | ORAL_TABLET | Freq: Four times a day (QID) | ORAL | Status: DC | PRN

## 2018-09-01 MED ORDER — KETOROLAC TROMETHAMINE 30 MG/ML IJ SOLN: 30.0000 mg | Freq: Once | INTRAMUSCULAR | Status: DC | PRN

## 2018-09-01 MED ORDER — ACETAMINOPHEN 650 MG RE SUPP: 650.0000 mg | Freq: Four times a day (QID) | RECTAL | Status: DC | PRN

## 2018-09-01 MED ORDER — OXYCODONE HCL 5 MG PO TABS: 5.0000 mg | ORAL_TABLET | Freq: Once | ORAL | Status: DC | PRN

## 2018-09-01 MED ORDER — BUPIVACAINE-EPINEPHRINE (PF) 0.5% -1:200000 IJ SOLN
INTRAMUSCULAR | Status: AC
  Filled 2018-09-01: qty 30

## 2018-09-01 SURGICAL SUPPLY — 33 items
APPLIER CLIP ROT 10 11.4 M/L (STAPLE) ×2
CABLE HIGH FREQUENCY MONO STRZ (ELECTRODE) ×2 IMPLANT
CHLORAPREP W/TINT 26 (MISCELLANEOUS) ×4 IMPLANT
CLIP APPLIE ROT 10 11.4 M/L (STAPLE) ×1 IMPLANT
COVER MAYO STAND STRL (DRAPES) ×2 IMPLANT
COVER SURGICAL LIGHT HANDLE (MISCELLANEOUS) ×2 IMPLANT
COVER WAND RF STERILE (DRAPES) IMPLANT
DECANTER SPIKE VIAL GLASS SM (MISCELLANEOUS) ×2 IMPLANT
DERMABOND ADVANCED (GAUZE/BANDAGES/DRESSINGS) ×1
DERMABOND ADVANCED .7 DNX12 (GAUZE/BANDAGES/DRESSINGS) ×1 IMPLANT
DRAPE C-ARM 42X120 X-RAY (DRAPES) ×2 IMPLANT
ELECT REM PT RETURN 15FT ADLT (MISCELLANEOUS) ×2 IMPLANT
GAUZE SPONGE 2X2 8PLY STRL LF (GAUZE/BANDAGES/DRESSINGS) IMPLANT
GLOVE SURG ORTHO 8.0 STRL STRW (GLOVE) ×2 IMPLANT
GOWN STRL REUS W/TWL XL LVL3 (GOWN DISPOSABLE) ×4 IMPLANT
HEMOSTAT SURGICEL 4X8 (HEMOSTASIS) IMPLANT
KIT BASIN OR (CUSTOM PROCEDURE TRAY) ×2 IMPLANT
KIT TURNOVER KIT A (KITS) IMPLANT
POUCH SPECIMEN RETRIEVAL 10MM (ENDOMECHANICALS) ×2 IMPLANT
SCISSORS LAP 5X35 DISP (ENDOMECHANICALS) ×2 IMPLANT
SET CHOLANGIOGRAPH MIX (MISCELLANEOUS) ×2 IMPLANT
SET IRRIG TUBING LAPAROSCOPIC (IRRIGATION / IRRIGATOR) ×2 IMPLANT
SET TUBE SMOKE EVAC HIGH FLOW (TUBING) ×2 IMPLANT
SLEEVE XCEL OPT CAN 5 100 (ENDOMECHANICALS) ×2 IMPLANT
SPONGE GAUZE 2X2 STER 10/PKG (GAUZE/BANDAGES/DRESSINGS)
STRIP CLOSURE SKIN 1/2X4 (GAUZE/BANDAGES/DRESSINGS) IMPLANT
SUT MNCRL AB 4-0 PS2 18 (SUTURE) ×2 IMPLANT
TOWEL OR 17X26 10 PK STRL BLUE (TOWEL DISPOSABLE) ×2 IMPLANT
TOWEL OR NON WOVEN STRL DISP B (DISPOSABLE) ×2 IMPLANT
TRAY LAPAROSCOPIC (CUSTOM PROCEDURE TRAY) ×2 IMPLANT
TROCAR BLADELESS OPT 5 100 (ENDOMECHANICALS) ×2 IMPLANT
TROCAR XCEL BLUNT TIP 100MML (ENDOMECHANICALS) ×2 IMPLANT
TROCAR XCEL NON-BLD 11X100MML (ENDOMECHANICALS) ×2 IMPLANT

## 2018-09-01 NOTE — Interval H&P Note (Signed)
History and Physical Interval Note:  09/01/2018 8:57 AM  Karen Banks  has presented today for surgery, with the diagnosis of CHOLELITHIASIS, ELEVATED LIVER ENZYMES.  The various methods of treatment have been discussed with the patient and family. After consideration of risks, benefits and other options for treatment, the patient has consented to  Procedure(s): LAPAROSCOPIC CHOLECYSTECTOMY WITH INTRAOPERATIVE CHOLANGIOGRAM (N/A) as a surgical intervention.  The patient's history has been reviewed, patient examined, no change in status, stable for surgery.  I have reviewed the patient's chart and labs.  Questions were answered to the patient's satisfaction.     [name]

## 2018-09-01 NOTE — Progress Notes (Signed)
Discharge instructions discussed with patient, verbalized agreement and understanding 

## 2018-09-01 NOTE — Anesthesia Procedure Notes (Signed)
Procedure Name: Intubation Date/Time: 09/01/2018 9:28 AM Performed by: Lavina Hamman, CRNA Pre-anesthesia Checklist: Patient identified, Emergency Drugs available, Suction available, Patient being monitored and Timeout performed Patient Re-evaluated:Patient Re-evaluated prior to induction Oxygen Delivery Method: Circle system utilized Preoxygenation: Pre-oxygenation with 100% oxygen Induction Type: IV induction Ventilation: Mask ventilation without difficulty Laryngoscope Size: Mac and 3 Grade View: Grade II Tube type: Oral Tube size: 7.0 mm Number of attempts: 1 Airway Equipment and Method: Stylet Placement Confirmation: ETT inserted through vocal cords under direct vision,  positive ETCO2,  CO2 detector and breath sounds checked- equal and bilateral Secured at: 21 cm Tube secured with: Tape Dental Injury: Teeth and Oropharynx as per pre-operative assessment

## 2018-09-01 NOTE — Discharge Summary (Signed)
Physician Discharge Summary [facility]  Patient ID: Karen Banks MRN: [id number] DOB/AGE: 02-27-1960 58 y.o.  Admit date: 09/01/2018 Discharge date: 09/01/2018  Admission Diagnoses:  Cholelithiasis, elevated liver enzymes  Discharge Diagnoses:  Principal Problem:   Cholelithiasis with chronic cholecystitis Active Problems:   Abnormal liver enzymes   Discharged Condition: good  Hospital Course: Patient was admitted for observation following gallbladder surgery.  Post op course was uncomplicated.  Pain was well controlled.  Tolerated diet.  Patient was prepared for discharge home on the afternoon of surgery day #0.  Consults: None  Treatments: surgery: lap chole with IOC  Discharge Exam: Blood pressure 115/61, pulse 81, temperature 98.5 F (36.9 C), temperature source Oral, resp. rate 15, height 5\' 1"  (1.549 m), weight 66 kg, SpO2 99 %. See nursing notes.  Disposition: Home  Discharge Instructions    Diet - low sodium heart healthy   Complete by: As directed    Discharge instructions   Complete by: As directed    [facility]  LAPAROSCOPIC SURGERY:  POST-OP INSTRUCTIONS  Always review your discharge instruction sheet given to you by the facility where your surgery was performed.  A prescription for pain medication may be given to you upon discharge.  Take your pain medication as prescribed.  If narcotic pain medicine is not needed, then you may take acetaminophen (Tylenol) or ibuprofen (Advil) as needed.  Take your usually prescribed medications unless otherwise directed.  If you need a refill on your pain medication, please contact your pharmacy.  They will contact our office to request authorization. Prescriptions will not be filled after 5 P.M. or on weekends.  You should follow a light diet the first few days after arrival home, such as soup and crackers or toast.  Be sure to include plenty of fluids daily.  Most  patients will experience some swelling and bruising in the area of the incisions.  Ice packs will help.  Swelling and bruising can take several days to resolve.   It is common to experience some constipation after surgery.  Increasing fluid intake and taking a stool softener (such as Colace) will usually help or prevent this problem from occurring.  A mild laxative (Milk of Magnesia or Miralax) should be taken according to package instructions if there has been no bowel movement after 48 hours.  You will likely have Dermabond (topical glue) over your incisions.  This seals the incisions and allows you to bathe and shower at any time after your surgery.  Glue should remain in place for up to 10 days.  It may be removed after 10 days by pealing off the Dermabond material or using Vaseline or naval jelly to remove.  If you have steri-strips over your incisions, you may remove the gauze bandage on the second day after surgery, and you may shower at that time.  Leave your steri-strips (small skin tapes) in place directly over the incision.  These strips should remain on the skin for 5-7 days and then be removed.  You may get them wet in the shower and pat them dry.  Any sutures or staples will be removed at the office during your follow-up visit.  ACTIVITIES:  You may resume regular (light) daily activities beginning the next day - such as daily self-care, walking, climbing stairs - gradually increasing activities as tolerated.  You may have sexual intercourse when it is comfortable.  Refrain from any heavy lifting or straining until approved by  your doctor.  You may drive when you are no longer taking prescription pain medication, when you can comfortably wear a seatbelt, and when you can safely maneuver your car and apply brakes.  You should see your doctor in the office for a follow-up appointment approximately 2-3 weeks after your surgery.  Make sure that you call for this appointment within a day or  two after you arrive home to insure a convenient appointment time.  WHEN TO CALL YOUR DOCTOR: Fever over 101.0 Inability to urinate Continued bleeding from incision Increased pain, redness, or drainage from the incision Increasing abdominal pain  The clinic staff is available to answer your questions during regular business hours.  Please don't hesitate to call and ask to speak to one of the nurses for clinical concerns.  If you have a medical emergency, go to the nearest emergency room or call 911.  A surgeon from [facility] is always on call for the hospital.  Earnstine Regal, MD, [facility] Office: [place] Free:  [place] [phone]  Website: [email or web address]   Increase activity slowly   Complete by: As directed    No dressing needed   Complete by: As directed      Allergies as of 09/01/2018   No Known Allergies     Medication List    TAKE these medications   benzonatate 100 MG capsule Commonly known as: TESSALON Take 1-2 capsules (100-200 mg total) by mouth 3 (three) times daily as needed for cough.   cetirizine 10 MG tablet Commonly known as: ZYRTEC Take 1 tablet (10 mg total) by mouth daily.   Claritin-D 24 Hour 10-240 MG 24 hr tablet Generic drug: loratadine-pseudoephedrine Take 1 tablet by mouth at bedtime.   fluticasone 50 MCG/ACT nasal spray Commonly known as: FLONASE Place 2 sprays into both nostrils daily.   Guaifenesin 1200 MG Tb12 Commonly known as: Mucinex Maximum Strength Take 1 tablet (1,200 mg total) by mouth every 12 (twelve) hours as needed.   HYDROcodone-acetaminophen 5-325 MG tablet Commonly known as: NORCO/VICODIN Take 1-2 tablets by mouth every 4 (four) hours as needed for moderate pain.   NovoLOG Mix 70/30 FlexPen (70-30) 100 UNIT/ML FlexPen Generic drug: insulin aspart protamine - aspart Inject 18-20 Units into the skin 2 (two) times daily with a meal.    Synthroid 112 MCG tablet Generic drug: levothyroxine Take 112 mcg by mouth daily before breakfast.      Follow-up Information    [name], MD. Schedule an appointment as soon as possible for a visit in 3 weeks.   Specialty: General Surgery Why: For wound re-check Contact information: [place] [address] [phone]           Earnstine Regal, MD, [facility] Office: [phone]   Signed: [name] 09/01/2018, 9:40 PM

## 2018-09-01 NOTE — Anesthesia Postprocedure Evaluation (Signed)
Anesthesia Post Note  Patient: Karen Banks  Procedure(s) Performed: LAPAROSCOPIC CHOLECYSTECTOMY WITH INTRAOPERATIVE CHOLANGIOGRAM (N/A )     Patient location during evaluation: PACU Anesthesia Type: General Level of consciousness: awake Pain management: pain level controlled Vital Signs Assessment: post-procedure vital signs reviewed and stable Respiratory status: spontaneous breathing Cardiovascular status: stable Postop Assessment: no apparent nausea or vomiting Anesthetic complications: no    Last Vitals:  Vitals:   09/01/18 1418 09/01/18 1529  BP: (!) 108/57 115/61  Pulse: 83 81  Resp: 15 15  Temp: 36.9 C 36.9 C  SpO2: 99% 99%    Last Pain:  Vitals:   09/01/18 1529  TempSrc: Oral  PainSc:    Pain Goal: Patients Stated Pain Goal: 4 (09/01/18 0752)                 Huston Foley

## 2018-09-01 NOTE — Transfer of Care (Signed)
Immediate Anesthesia Transfer of Care Note  Patient: Karen Banks  Procedure(s) Performed: Procedure(s): LAPAROSCOPIC CHOLECYSTECTOMY WITH INTRAOPERATIVE CHOLANGIOGRAM (N/A)  Patient Location: PACU  Anesthesia Type:General  Level of Consciousness:  sedated, patient cooperative and responds to stimulation  Airway & Oxygen Therapy:Patient Spontanous Breathing and Patient connected to face mask oxgen  Post-op Assessment:  Report given to PACU RN and Post -op Vital signs reviewed and stable  Post vital signs:  Reviewed and stable  Last Vitals:  Vitals:   09/01/18 0737  BP: 130/69  Pulse: 73  Resp: 15  Temp: 36.7 C  SpO2: 056%    Complications: No apparent anesthesia complications

## 2018-09-01 NOTE — Op Note (Signed)
Procedure Note  Pre-operative Diagnosis:  Abnormal liver enzymes, cholelithiasis  Post-operative Diagnosis:  same  Surgeon:  [name], MD  Assistant:  none   Procedure:  Laparoscopic cholecystectomy with intra-operative cholangiography  Anesthesia:  General  Estimated Blood Loss:  minimal  Drains: none         Specimen: gallbladder to pathology  Indications:  Patient is referred by Dr. Dagmar Hait for surgical evaluation and management of cholelithiasis and elevated liver function test in the setting of type 1 diabetes mellitus. Patient was discovered on routine laboratory studies to have elevated liver function test. Additional studies were negative for hepatitis. She underwent an ultrasound examination on June 02, 2018 which did show cholelithiasis without evidence of cholecystitis. There was no biliary dilatation. There were changes of fatty liver. Patient was diagnosed with type 1 diabetes mellitus approximately 3 years ago. Patient denies any signs or symptoms of biliary colic. She denies jaundice or acholic stools. She denies any history of hepatitis or pancreatitis. There is a family history of gallbladder disease and the patient's parents. Her mother underwent cholecystectomy at a young age. Patient has had prior abdominal hysterectomy and cesarean section. She has had no upper abdominal surgery. She presents today accompanied by her husband to discuss cholecystectomy for management of cholelithiasis, abnormal liver function test, in the setting of type 1 diabetes.  Procedure Details:  The patient was seen in the pre-op holding area. The risks, benefits, complications, treatment options, and expected outcomes were previously discussed with the patient. The patient agreed with the proposed plan and has signed the informed consent form.  The patient was transported to operating room # 1 at the [facility]. The patient was placed in the supine position on the  operating room table. Following induction of general anesthesia, the abdomen was prepped and draped in the usual aseptic fashion.  An incision was made in the skin near the umbilicus. The midline fascia was incised and the peritoneal cavity was entered and a Hasson cannula was introduced under direct vision. The cannula was secured with a 0-Vicryl pursestring suture. Pneumoperitoneum was established with carbon dioxide. Additional cannulae were introduced under direct vision along the right costal margin in the midline, mid-clavicular line, and anterior axillary line.   The gallbladder was identified and the fundus grasped and retracted cephalad. Adhesions were taken down bluntly and the electrocautery was utilized as needed, taking care not to involve any adjacent structures. The infundibulum was grasped and retracted laterally, exposing the peritoneum overlying the triangle of Calot. The peritoneum was incised and structures exposed with blunt dissection. The cystic duct was clearly identified, bluntly dissected circumferentially, and clipped at the neck of the gallbladder.  An incision was made in the cystic duct and the cholangiogram catheter introduced. The catheter was secured using an ligaclip.  Real-time cholangiography was performed using C-arm fluoroscopy.  There was rapid filling of a normal caliber common bile duct.  There was reflux of contrast into the left and right hepatic ductal systems.  There was free flow distally into the duodenum without filling defect or obstruction.  The catheter was removed from the peritoneal cavity.  The cystic duct was then ligated with ligaclips and divided. The cystic artery was identified, dissected circumferentially, ligated with ligaclips, and divided.  The gallbladder was dissected away from the gallbladder bed using the electrocautery for hemostasis. The gallbladder was completely removed from the liver and placed into an endocatch bag. The gallbladder  was removed in the endocatch bag  through the umbilical port site and submitted to pathology for review.  The right upper quadrant was irrigated and the gallbladder bed was inspected. Hemostasis was achieved with the electrocautery.  Cannulae were removed under direct vision and good hemostasis was noted. Pneumoperitoneum was released and the majority of the carbon dioxide evacuated. The umbilical wound was irrigated and the fascia was then closed with the pursestring suture.  Local anesthetic was infiltrated at all port sites. Skin incisions were closed with 4-0 Monocril subcuticular sutures and Dermabond was applied.  Instrument, sponge, and needle counts were correct at the conclusion of the case.  The patient was awakened from anesthesia and brought to the recovery room in stable condition.  The patient tolerated the procedure well.   Darnell Levelodd Jeremy Ditullio, MD [facility] Office: [phone]

## 2018-09-01 NOTE — Anesthesia Preprocedure Evaluation (Addendum)
Anesthesia Evaluation  Patient identified by MRN, date of birth, ID band Patient awake    Reviewed: Allergy & Precautions, NPO status , Patient's Chart, lab work & pertinent test results  Airway Mallampati: I       Dental no notable dental hx. (+) Teeth Intact   Pulmonary asthma ,    Pulmonary exam normal breath sounds clear to auscultation       Cardiovascular Normal cardiovascular exam Rhythm:Regular Rate:Normal     Neuro/Psych negative neurological ROS  negative psych ROS   GI/Hepatic negative GI ROS, Neg liver ROS,   Endo/Other  diabetes, Insulin DependentHypothyroidism   Renal/GU negative Renal ROS  negative genitourinary   Musculoskeletal negative musculoskeletal ROS (+)   Abdominal Normal abdominal exam  (+)   Peds  Hematology negative hematology ROS (+)   Anesthesia Other Findings   Reproductive/Obstetrics                             Anesthesia Physical Anesthesia Plan  ASA: II  Anesthesia Plan: General   Post-op Pain Management:    Induction: Intravenous  PONV Risk Score and Plan: Ondansetron, Dexamethasone and Midazolam  Airway Management Planned: Oral ETT  Additional Equipment:   Intra-op Plan:   Post-operative Plan: Extubation in OR  Informed Consent: I have reviewed the patients History and Physical, chart, labs and discussed the procedure including the risks, benefits and alternatives for the proposed anesthesia with the patient or authorized representative who has indicated his/her understanding and acceptance.     Dental advisory given  Plan Discussed with: CRNA  Anesthesia Plan Comments:         Anesthesia Quick Evaluation

## 2018-09-01 NOTE — Discharge Instructions (Signed)
[facility]  LAPAROSCOPIC SURGERY:  POST-OP INSTRUCTIONS  Always review your discharge instruction sheet given to you by the facility where your surgery was performed.  A prescription for pain medication may be given to you upon discharge.  Take your pain medication as prescribed.  If narcotic pain medicine is not needed, then you may take acetaminophen (Tylenol) or ibuprofen (Advil) as needed.  Take your usually prescribed medications unless otherwise directed.  If you need a refill on your pain medication, please contact your pharmacy.  They will contact our office to request authorization. Prescriptions will not be filled after 5 P.M. or on weekends.  You should follow a light diet the first few days after arrival home, such as soup and crackers or toast.  Be sure to include plenty of fluids daily.  Most patients will experience some swelling and bruising in the area of the incisions.  Ice packs will help.  Swelling and bruising can take several days to resolve.   It is common to experience some constipation after surgery.  Increasing fluid intake and taking a stool softener (such as Colace) will usually help or prevent this problem from occurring.  A mild laxative (Milk of Magnesia or Miralax) should be taken according to package instructions if there has been no bowel movement after 48 hours.  You will likely have Dermabond (topical glue) over your incisions.  This seals the incisions and allows you to bathe and shower at any time after your surgery.  Glue should remain in place for up to 10 days.  It may be removed after 10 days by pealing off the Dermabond material or using Vaseline or naval jelly to remove.  If you have steri-strips over your incisions, you may remove the gauze bandage on the second day after surgery, and you may shower at that time.  Leave your steri-strips (small skin tapes) in place directly over the incision.  These strips should remain on the  skin for 5-7 days and then be removed.  You may get them wet in the shower and pat them dry.  Any sutures or staples will be removed at the office during your follow-up visit.  ACTIVITIES:  You may resume regular (light) daily activities beginning the next day - such as daily self-care, walking, climbing stairs - gradually increasing activities as tolerated.  You may have sexual intercourse when it is comfortable.  Refrain from any heavy lifting or straining until approved by your doctor.  You may drive when you are no longer taking prescription pain medication, when you can comfortably wear a seatbelt, and when you can safely maneuver your car and apply brakes.  You should see your doctor in the office for a follow-up appointment approximately 2-3 weeks after your surgery.  Make sure that you call for this appointment within a day or two after you arrive home to insure a convenient appointment time.  WHEN TO CALL YOUR DOCTOR: 1. Fever over 101.0 2. Inability to urinate 3. Continued bleeding from incision 4. Increased pain, redness, or drainage from the incision 5. Increasing abdominal pain  The clinic staff is available to answer your questions during regular business hours.  Please don't hesitate to call and ask to speak to one of the nurses for clinical concerns.  If you have a medical emergency, go to the nearest emergency room or call 911.  A surgeon from [facility] is always on call for the hospital.  Joshue Badal M. Raizel Wesolowski, MD, FACS [facility] Office: [phone] Toll Free:  [phone] FAX [phone]  Website: [email or web address] 

## 2018-09-02 ENCOUNTER — Encounter (HOSPITAL_COMMUNITY): Payer: Self-pay | Admitting: Surgery

## 2020-04-11 IMAGING — RF INTRAOPERATIVE CHOLANGIOGRAM
1 series · 5 of 5 positions shown · non-contrast
Comparison: Right upper quadrant abdominal ultrasound-06/02/2018

CLINICAL DATA: Intraoperative cholangiogram during laparoscopic
cholecystectomy.

EXAM:
INTRAOPERATIVE CHOLANGIOGRAM
FLUOROSCOPY TIME:  12 seconds

[Series 1: run · 2 acquisitions, 5 frames shown]
[im 1/2]
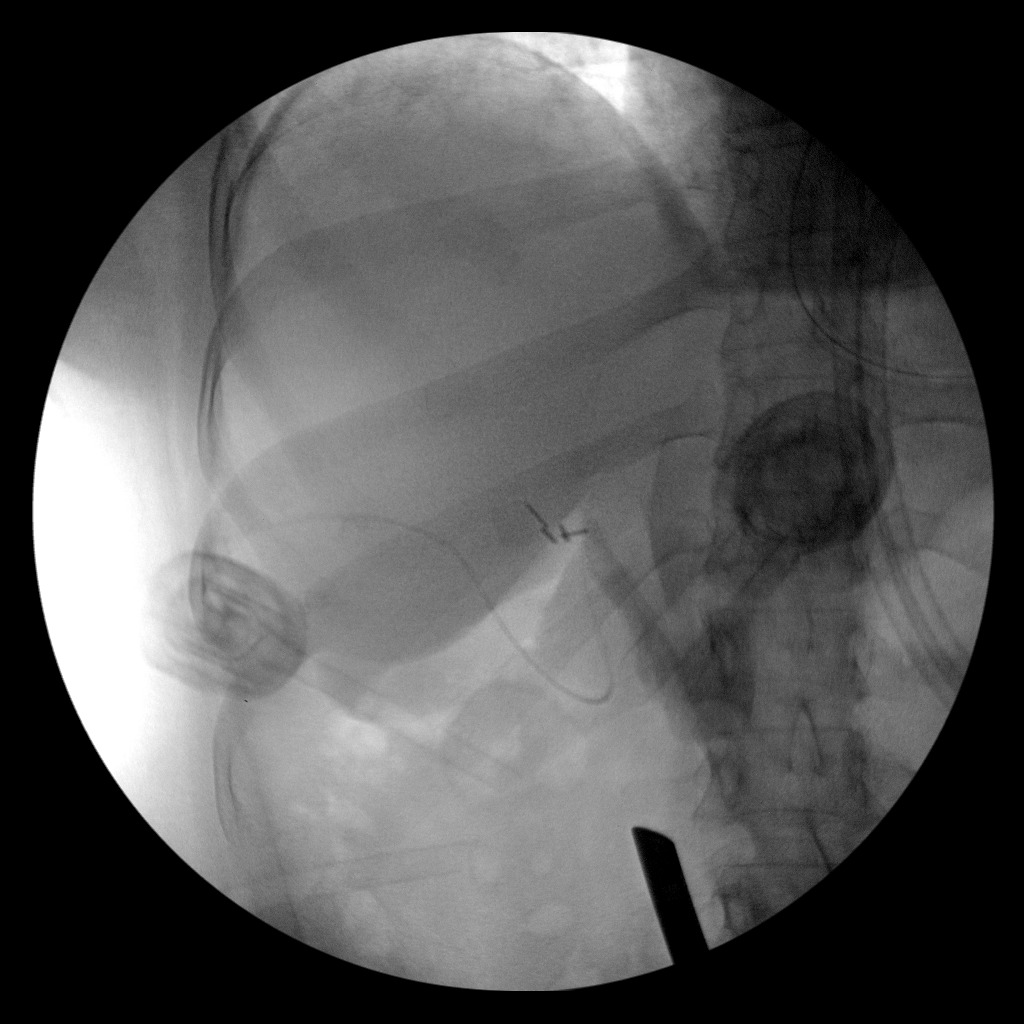
[im 2/2]
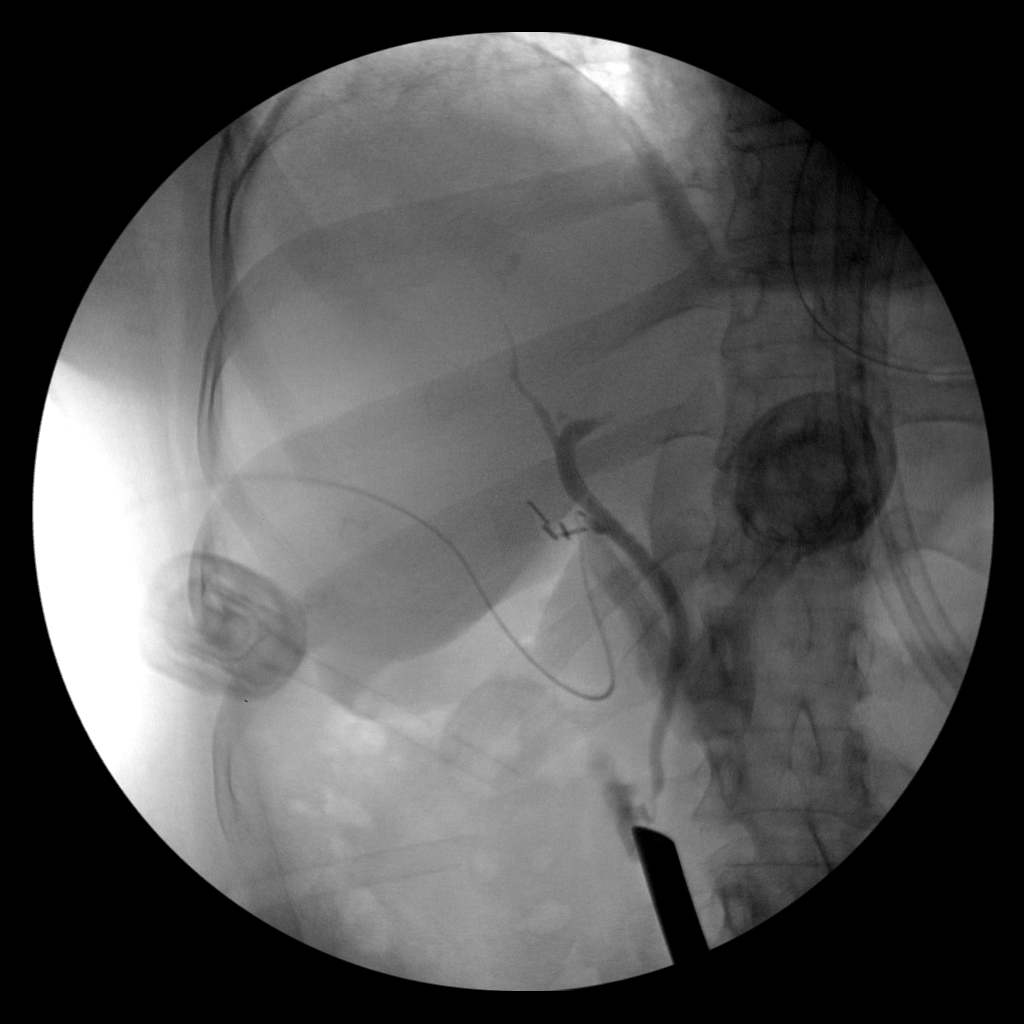
[im 2/2]
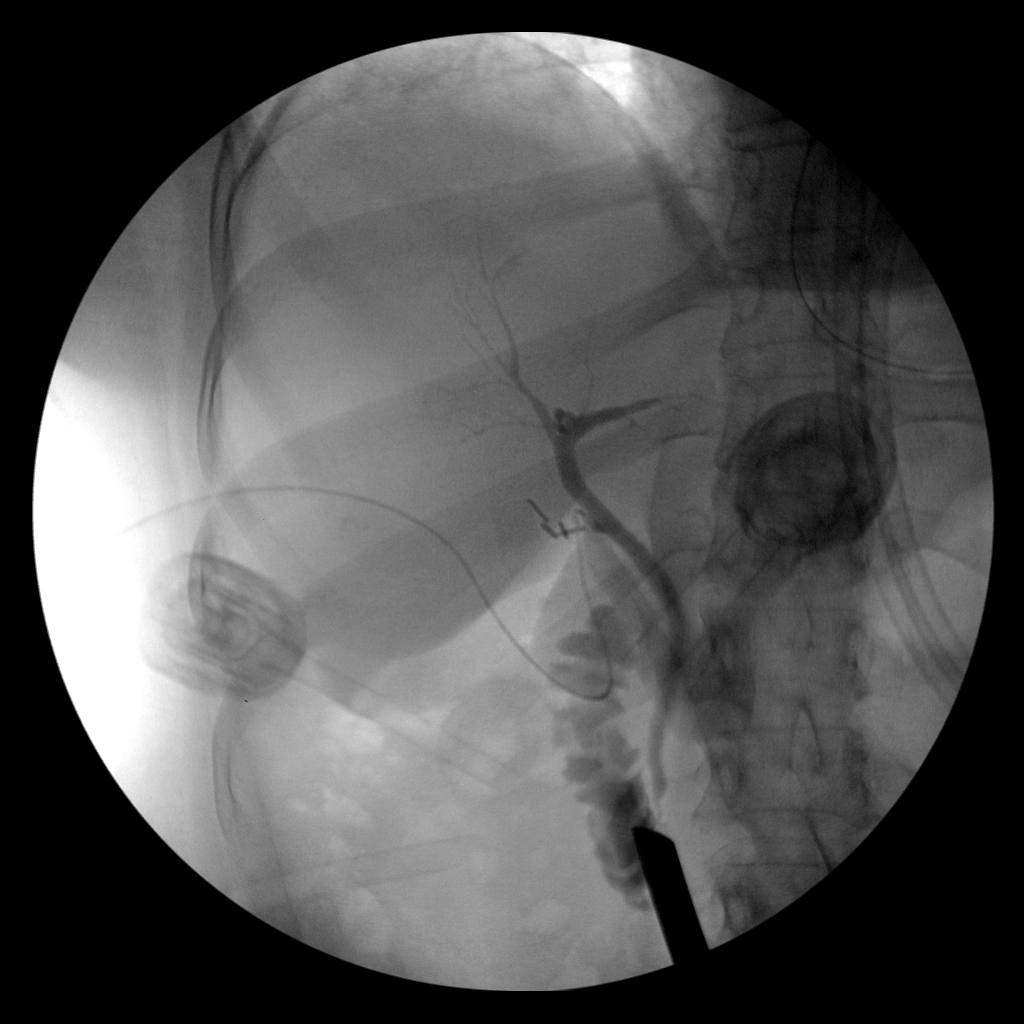
[im 2/2]
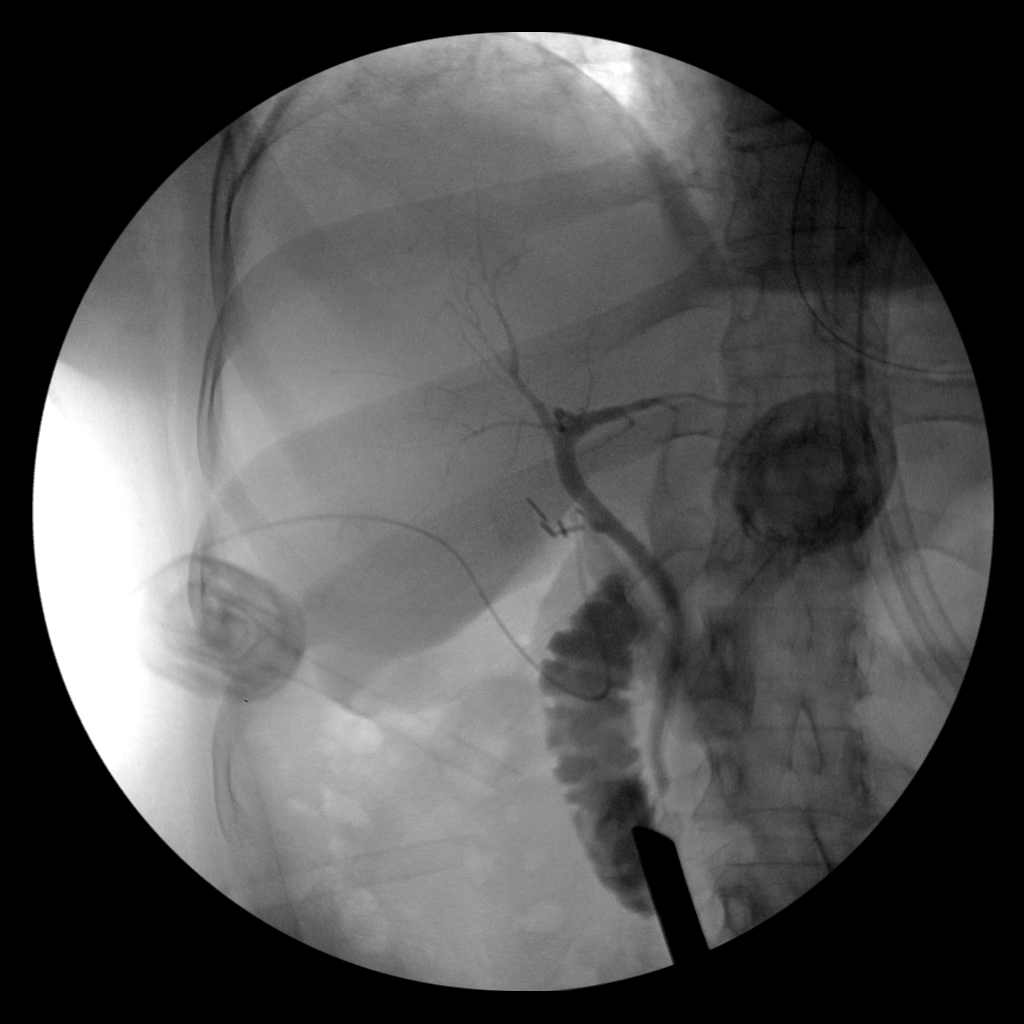
[im 2/2]
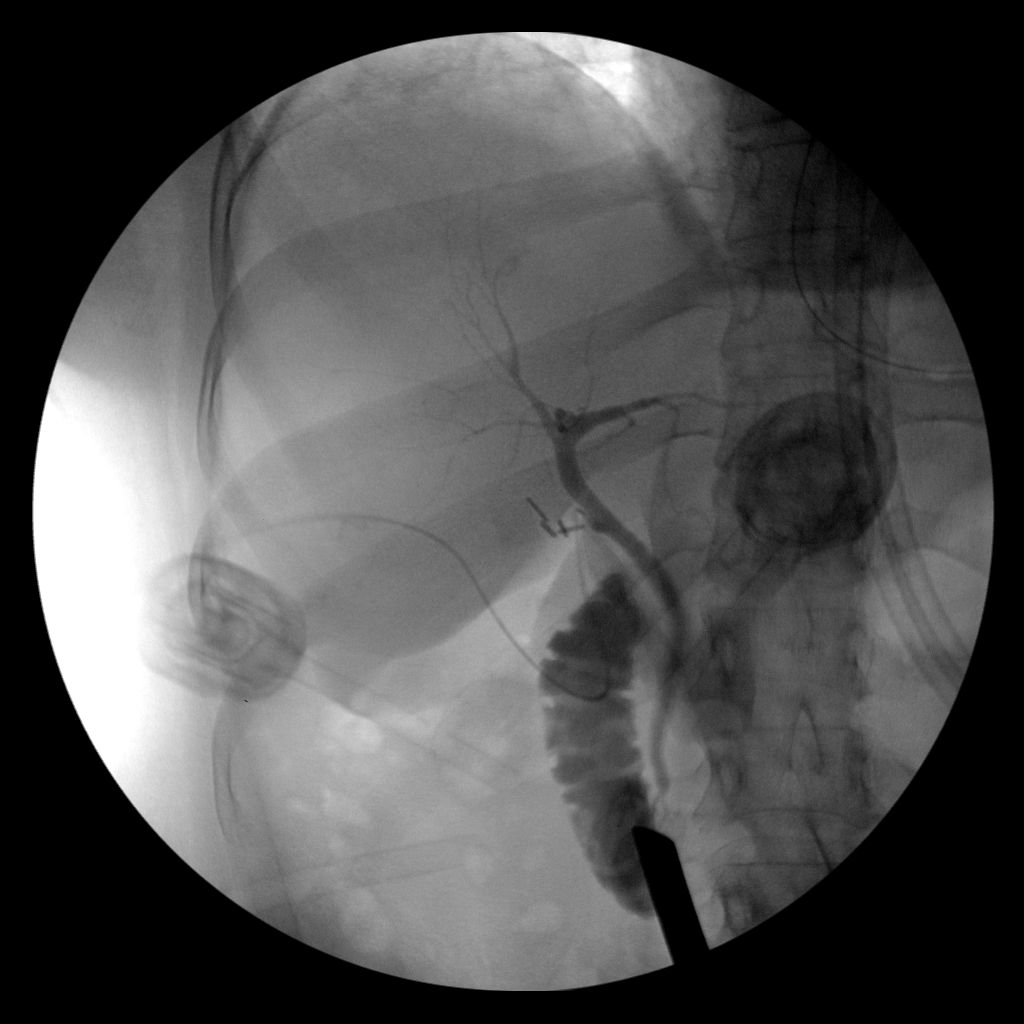

[5 of 5 positions shown; findings below may reference images not displayed]

FINDINGS: Intraoperative cholangiographic images of the right upper abdominal
quadrant during laparoscopic cholecystectomy are provided for
review.

Surgical clips overlie the expected location of the gallbladder
fossa.

Contrast injection demonstrates selective cannulation of the central
aspect of the cystic duct.

There is passage of contrast through the central aspect of the
cystic duct with filling of a non dilated common bile duct. There is
passage of contrast though the CBD and into the descending portion
of the duodenum.

There is minimal reflux of injected contrast into the common hepatic
duct and central aspect of the non dilated intrahepatic biliary
system.

There are no discrete filling defects within the opacified portions
of the biliary system to suggest the presence of
choledocholithiasis.
IMPRESSION: No evidence of choledocholithiasis.

## 2020-05-02 ENCOUNTER — Other Ambulatory Visit: Payer: Self-pay | Admitting: Internal Medicine

## 2020-05-02 DIAGNOSIS — Z0000 Encounter for general adult medical examination without abnormal findings: Secondary | ICD-10-CM

## 2020-06-17 ENCOUNTER — Telehealth: Payer: Self-pay

## 2020-06-17 NOTE — Telephone Encounter (Signed)
Patient is would like to know if you would except her as a new patient she heard you were the best

## 2020-06-26 ENCOUNTER — Ambulatory Visit
Admission: RE | Admit: 2020-06-26 | Discharge: 2020-06-26 | Disposition: A | Discharge status: Home / Self Care | Payer: 59 | Source: Ambulatory Visit | Attending: Internal Medicine | Admitting: Internal Medicine

## 2020-06-26 ENCOUNTER — Other Ambulatory Visit: Payer: Self-pay

## 2020-06-26 DIAGNOSIS — Z0000 Encounter for general adult medical examination without abnormal findings: Secondary | ICD-10-CM

## 2020-06-27 NOTE — Telephone Encounter (Signed)
That is okay, please be aware I won't be able to see her in few weeks

## 2020-06-27 NOTE — Telephone Encounter (Signed)
Pt, would like to know if you will except her and her husband as new patient  Please advice

## 2020-06-27 NOTE — Telephone Encounter (Signed)
Please advise 

## 2020-06-30 NOTE — Telephone Encounter (Signed)
Call pt, no respond

## 2020-09-09 ENCOUNTER — Ambulatory Visit: Payer: 59 | Admitting: Emergency Medicine

## 2020-09-09 ENCOUNTER — Other Ambulatory Visit: Payer: Self-pay

## 2020-09-09 ENCOUNTER — Encounter: Payer: Self-pay | Admitting: Emergency Medicine

## 2020-09-09 VITALS — BP 138/68 | HR 77 | Temp 98.7°F | Ht 61.0 in | Wt 169.0 lb

## 2020-09-09 DIAGNOSIS — E039 Hypothyroidism, unspecified: Secondary | ICD-10-CM | POA: Diagnosis not present

## 2020-09-09 DIAGNOSIS — Z7689 Persons encountering health services in other specified circumstances: Secondary | ICD-10-CM

## 2020-09-09 DIAGNOSIS — E1165 Type 2 diabetes mellitus with hyperglycemia: Secondary | ICD-10-CM | POA: Insufficient documentation

## 2020-09-09 DIAGNOSIS — Z780 Asymptomatic menopausal state: Secondary | ICD-10-CM

## 2020-09-09 DIAGNOSIS — E1065 Type 1 diabetes mellitus with hyperglycemia: Secondary | ICD-10-CM | POA: Diagnosis not present

## 2020-09-09 NOTE — Patient Instructions (Signed)
Health Maintenance, Female Adopting a healthy lifestyle and getting preventive care are important in promoting health and wellness. Ask your health care provider about: The right schedule for you to have regular tests and exams. Things you can do on your own to prevent diseases and keep yourself healthy. What should I know about diet, weight, and exercise? Eat a healthy diet  Eat a diet that includes plenty of vegetables, fruits, low-fat dairy products, and lean protein. Do not eat a lot of foods that are high in solid fats, added sugars, or sodium.  Maintain a healthy weight Body mass index (BMI) is used to identify weight problems. It estimates body fat based on height and weight. Your health care provider can help determineyour BMI and help you achieve or maintain a healthy weight. Get regular exercise Get regular exercise. This is one of the most important things you can do for your health. Most adults should: Exercise for at least 150 minutes each week. The exercise should increase your heart rate and make you sweat (moderate-intensity exercise). Do strengthening exercises at least twice a week. This is in addition to the moderate-intensity exercise. Spend less time sitting. Even light physical activity can be beneficial. Watch cholesterol and blood lipids Have your blood tested for lipids and cholesterol at 59 years of age, then havethis test every 5 years. Have your cholesterol levels checked more often if: Your lipid or cholesterol levels are high. You are older than 59 years of age. You are at high risk for heart disease. What should I know about cancer screening? Depending on your health history and family history, you may need to have cancer screening at various ages. This may include screening for: Breast cancer. Cervical cancer. Colorectal cancer. Skin cancer. Lung cancer. What should I know about heart disease, diabetes, and high blood pressure? Blood pressure and heart  disease High blood pressure causes heart disease and increases the risk of stroke. This is more likely to develop in people who have high blood pressure readings, are of African descent, or are overweight. Have your blood pressure checked: Every 3-5 years if you are 18-39 years of age. Every year if you are 40 years old or older. Diabetes Have regular diabetes screenings. This checks your fasting blood sugar level. Have the screening done: Once every three years after age 40 if you are at a normal weight and have a low risk for diabetes. More often and at a younger age if you are overweight or have a high risk for diabetes. What should I know about preventing infection? Hepatitis B If you have a higher risk for hepatitis B, you should be screened for this virus. Talk with your health care provider to find out if you are at risk forhepatitis B infection. Hepatitis C Testing is recommended for: Everyone born from 1945 through 1965. Anyone with known risk factors for hepatitis C. Sexually transmitted infections (STIs) Get screened for STIs, including gonorrhea and chlamydia, if: You are sexually active and are younger than 59 years of age. You are older than 59 years of age and your health care provider tells you that you are at risk for this type of infection. Your sexual activity has changed since you were last screened, and you are at increased risk for chlamydia or gonorrhea. Ask your health care provider if you are at risk. Ask your health care provider about whether you are at high risk for HIV. Your health care provider may recommend a prescription medicine to help   prevent HIV infection. If you choose to take medicine to prevent HIV, you should first get tested for HIV. You should then be tested every 3 months for as long as you are taking the medicine. Pregnancy If you are about to stop having your period (premenopausal) and you may become pregnant, seek counseling before you get  pregnant. Take 400 to 800 micrograms (mcg) of folic acid every day if you become pregnant. Ask for birth control (contraception) if you want to prevent pregnancy. Osteoporosis and menopause Osteoporosis is a disease in which the bones lose minerals and strength with aging. This can result in bone fractures. If you are 65 years old or older, or if you are at risk for osteoporosis and fractures, ask your health care provider if you should: Be screened for bone loss. Take a calcium or vitamin D supplement to lower your risk of fractures. Be given hormone replacement therapy (HRT) to treat symptoms of menopause. Follow these instructions at home: Lifestyle Do not use any products that contain nicotine or tobacco, such as cigarettes, e-cigarettes, and chewing tobacco. If you need help quitting, ask your health care provider. Do not use street drugs. Do not share needles. Ask your health care provider for help if you need support or information about quitting drugs. Alcohol use Do not drink alcohol if: Your health care provider tells you not to drink. You are pregnant, may be pregnant, or are planning to become pregnant. If you drink alcohol: Limit how much you use to 0-1 drink a day. Limit intake if you are breastfeeding. Be aware of how much alcohol is in your drink. In the [place] one drink equals one 12 oz bottle of beer (355 mL), one 5 oz glass of wine (148 mL), or one 1 oz glass of hard liquor (44 mL). General instructions Schedule regular health, dental, and eye exams. Stay current with your vaccines. Tell your health care provider if: You often feel depressed. You have ever been abused or do not feel safe at home. Summary Adopting a healthy lifestyle and getting preventive care are important in promoting health and wellness. Follow your health care provider's instructions about healthy diet, exercising, and getting tested or screened for diseases. Follow your health care provider's  instructions on monitoring your cholesterol and blood pressure. This information is not intended to replace advice given to you by your health care provider. Make sure you discuss any questions you have with your healthcare provider. Document Revised: 02/01/2018 Document Reviewed: 02/01/2018 Elsevier Patient Education  2022 [employer]  

## 2020-09-09 NOTE — Progress Notes (Signed)
Karen Banks 60 y.o.   Chief Complaint  Patient presents with   New Patient (Initial Visit)    Pt states that her hands and feet swell at times, pt unsure of the cause    HISTORY OF PRESENT ILLNESS: This is a 60 y.o. female first visit to this office here to establish care with me. [place] Has history of diabetes type 2.  Failed metformin.  Told her pancreas was not working well. Started on insulin.  Presently taking NovoLog Mix 70/30 about 14 units in the morning and 25 units in the evening depending on carbohydrate intake.  Sees endocrinologist with [facility] physicians Dr. Debara Pickett on a regular basis.  Also has history of hypothyroidism on Synthroid 137 mcg daily. Complaining of weight gain about 40 pounds in the last 5 years. Postmenopausal.  Needs GYN referral. Complains of intermittent swelling of hands and feet. No other complaints or medical concerns today.   HPI   Prior to Admission medications   Medication Sig Start Date End Date Taking? Authorizing Provider  levothyroxine (SYNTHROID) 112 MCG tablet Take 112 mcg by mouth daily before breakfast.  12/03/16  Yes [provider]  NOVOLOG MIX 70/30 FLEXPEN (70-30) 100 UNIT/ML FlexPen Inject 18-20 Units into the skin See admin instructions. Take 18 units with breakfast and 20 units with supper 03/07/17  Yes [provider]  benzonatate (TESSALON) 100 MG capsule Take 1-2 capsules (100-200 mg total) by mouth 3 (three) times daily as needed for cough. Patient not taking: No sig reported 03/02/17   Trena Platt D, PA  cetirizine (ZYRTEC) 10 MG tablet Take 1 tablet (10 mg total) by mouth daily. Patient not taking: No sig reported 03/02/17   Trena Platt D, PA  fluticasone (FLONASE) 50 MCG/ACT nasal spray Place 2 sprays into both nostrils daily. Patient not taking: Reported on 09/09/2020 03/02/17   Trena Platt D, PA  Guaifenesin [facility] MAXIMUM STRENGTH) 1200 MG TB12 Take 1 tablet  (1,200 mg total) by mouth every 12 (twelve) hours as needed. Patient not taking: No sig reported 03/02/17   Trena Platt D, PA  HYDROcodone-acetaminophen (NORCO/VICODIN) 5-325 MG tablet Take 1-2 tablets by mouth every 4 (four) hours as needed for moderate pain. Patient not taking: Reported on 09/09/2020 09/01/18   Darnell Level, MD  loratadine-pseudoephedrine (CLARITIN-D 24 HOUR) 10-240 MG 24 hr tablet Take 1 tablet by mouth at bedtime. Patient not taking: Reported on 09/09/2020    [provider]    No Known Allergies  Patient Active Problem List   Diagnosis Date Noted   Abnormal liver enzymes 09/01/2018   Cholelithiasis with chronic cholecystitis 08/30/2018   Bilateral hearing loss 03/10/2017    Past Medical History:  Diagnosis Date   Allergy    Diabetes mellitus without complication (HCC)    Endometriosis    Hypothyroidism     Past Surgical History:  Procedure Laterality Date   ABDOMINAL HYSTERECTOMY     CESAREAN SECTION     CHOLECYSTECTOMY N/A 09/01/2018   Procedure: LAPAROSCOPIC CHOLECYSTECTOMY WITH INTRAOPERATIVE CHOLANGIOGRAM;  Surgeon: Darnell Level, MD;  Location: [facility];  Service: General;  Laterality: N/A;   TONSILLECTOMY      Social History   Socioeconomic History   Marital status: Married    Spouse name: Not on file   Number of children: 1   Years of education:  2 years college   Highest education level: Not on file  Occupational History   Occupation: Diplomatic Services operational officer  Tobacco Use  Smoking status: Never   Smokeless tobacco: Never  Vaping Use   Vaping Use: Never used  Substance and Sexual Activity   Alcohol use: Yes    Comment: 2 glasses of wine per day   Drug use: No   Sexual activity: Not on file  Other Topics Concern   Not on file  Social History Narrative   Lives at home with husband.   Right-handed.   1 cup caffeine daily.   Social Determinants of Health   Financial Resource Strain: Not on file  Food Insecurity: Not on file   Transportation Needs: Not on file  Physical Activity: Not on file  Stress: Not on file  Social Connections: Not on file  Intimate Partner Violence: Not on file    Family History  Problem Relation Age of Onset   Hypothyroidism Mother    Hypertension Father    Hyperlipidemia Father    Colon cancer Father    Diabetes Father    Heart attack Maternal Grandfather    Heart disease Maternal Grandfather    Colon cancer Paternal Aunt    Alzheimer's disease Maternal Aunt    Alzheimer's disease Maternal Uncle      Review of Systems  Constitutional: Negative.  Negative for chills and fever.  HENT: Negative.  Negative for congestion and sore throat.   Respiratory: Negative.  Negative for cough and shortness of breath.   Cardiovascular: Negative.  Negative for chest pain and palpitations.  Gastrointestinal: Negative.  Negative for abdominal pain, diarrhea, nausea and vomiting.  Genitourinary: Negative.   Skin: Negative.  Negative for rash.  Neurological: Negative.  Negative for dizziness and headaches.  All other systems reviewed and are negative.   Physical Exam Vitals reviewed.  Constitutional:      Appearance: Normal appearance.  HENT:     Head: Normocephalic.  Eyes:     Extraocular Movements: Extraocular movements intact.     Conjunctiva/sclera: Conjunctivae normal.     Pupils: Pupils are equal, round, and reactive to light.  Neck:     Vascular: No carotid bruit.  Cardiovascular:     Rate and Rhythm: Normal rate and regular rhythm.     Pulses: Normal pulses.     Heart sounds: Normal heart sounds.  Pulmonary:     Effort: Pulmonary effort is normal.     Breath sounds: Normal breath sounds.  Musculoskeletal:        General: Normal range of motion.     Cervical back: Normal range of motion and neck supple. No tenderness.     Right lower leg: No edema.     Left lower leg: No edema.  Lymphadenopathy:     Cervical: No cervical adenopathy.  Skin:    General: Skin is warm  and dry.     Capillary Refill: Capillary refill takes less than 2 seconds.  Neurological:     General: No focal deficit present.     Mental Status: She is alert and oriented to person, place, and time.  Psychiatric:        Mood and Affect: Mood normal.        Behavior: Behavior normal.     ASSESSMENT & PLAN: A total of 30 minutes was spent with the patient and counseling/coordination of care regarding preparing for this visit, review of most recent office visit notes, establishing care with me, review of past medical history, review of all medications, health maintenance items, education on nutrition, prognosis, documentation and need for follow-up.  Clinically stable.  No medical concerns identified during this visit.  Continue present medications.  No changes. Renea Eevelyn was seen today for new patient (initial visit).  Diagnoses and all orders for this visit:  Type 2 diabetes mellitus with hyperglycemia, with long-term current use of insulin (HCC)  Hypothyroidism, unspecified type  Encounter to establish care -     Ambulatory referral to Gynecology  Postmenopausal -     Ambulatory referral to Gynecology  Patient Instructions  Health Maintenance, Female Adopting a healthy lifestyle and getting preventive care are important in promoting health and wellness. Ask your health care provider about: The right schedule for you to have regular tests and exams. Things you can do on your own to prevent diseases and keep yourself healthy. What should I know about diet, weight, and exercise? Eat a healthy diet  Eat a diet that includes plenty of vegetables, fruits, low-fat dairy products, and lean protein. Do not eat a lot of foods that are high in solid fats, added sugars, or sodium.  Maintain a healthy weight Body mass index (BMI) is used to identify weight problems. It estimates body fat based on height and weight. Your health care provider can help determineyour BMI and help you achieve  or maintain a healthy weight. Get regular exercise Get regular exercise. This is one of the most important things you can do for your health. Most adults should: Exercise for at least 150 minutes each week. The exercise should increase your heart rate and make you sweat (moderate-intensity exercise). Do strengthening exercises at least twice a week. This is in addition to the moderate-intensity exercise. Spend less time sitting. Even light physical activity can be beneficial. Watch cholesterol and blood lipids Have your blood tested for lipids and cholesterol at 59 years of age, then havethis test every 5 years. Have your cholesterol levels checked more often if: Your lipid or cholesterol levels are high. You are older than 10140 years of age. You are at high risk for heart disease. What should I know about cancer screening? Depending on your health history and family history, you may need to have cancer screening at various ages. This may include screening for: Breast cancer. Cervical cancer. Colorectal cancer. Skin cancer. Lung cancer. What should I know about heart disease, diabetes, and high blood pressure? Blood pressure and heart disease High blood pressure causes heart disease and increases the risk of stroke. This is more likely to develop in people who have high blood pressure readings, are of African descent, or are overweight. Have your blood pressure checked: Every 3-5 years if you are 6518-59 years of age. Every year if you are 59 years old or older. Diabetes Have regular diabetes screenings. This checks your fasting blood sugar level. Have the screening done: Once every three years after age 59 if you are at a normal weight and have a low risk for diabetes. More often and at a younger age if you are overweight or have a high risk for diabetes. What should I know about preventing infection? Hepatitis B If you have a higher risk for hepatitis B, you should be screened for this  virus. Talk with your health care provider to find out if you are at risk forhepatitis B infection. Hepatitis C Testing is recommended for: Everyone born from [birth year] through 1964. Anyone with known risk factors for hepatitis C. Sexually transmitted infections (STIs) Get screened for STIs, including gonorrhea and chlamydia, if: You are sexually active and are younger than 59 years of age. You  are older than 59 years of age and your health care provider tells you that you are at risk for this type of infection. Your sexual activity has changed since you were last screened, and you are at increased risk for chlamydia or gonorrhea. Ask your health care provider if you are at risk. Ask your health care provider about whether you are at high risk for HIV. Your health care provider may recommend a prescription medicine to help prevent HIV infection. If you choose to take medicine to prevent HIV, you should first get tested for HIV. You should then be tested every 3 months for as long as you are taking the medicine. Pregnancy If you are about to stop having your period (premenopausal) and you may become pregnant, seek counseling before you get pregnant. Take 400 to 800 micrograms (mcg) of folic acid every day if you become pregnant. Ask for birth control (contraception) if you want to prevent pregnancy. Osteoporosis and menopause Osteoporosis is a disease in which the bones lose minerals and strength with aging. This can result in bone fractures. If you are 3 years old or older, or if you are at risk for osteoporosis and fractures, ask your health care provider if you should: Be screened for bone loss. Take a calcium or vitamin D supplement to lower your risk of fractures. Be given hormone replacement therapy (HRT) to treat symptoms of menopause. Follow these instructions at home: Lifestyle Do not use any products that contain nicotine or tobacco, such as cigarettes, e-cigarettes, and chewing tobacco.  If you need help quitting, ask your health care provider. Do not use street drugs. Do not share needles. Ask your health care provider for help if you need support or information about quitting drugs. Alcohol use Do not drink alcohol if: Your health care provider tells you not to drink. You are pregnant, may be pregnant, or are planning to become pregnant. If you drink alcohol: Limit how much you use to 0-1 drink a day. Limit intake if you are breastfeeding. Be aware of how much alcohol is in your drink. In the [place] one drink equals one 12 oz bottle of beer (355 mL), one 5 oz glass of wine (148 mL), or one 1 oz glass of hard liquor (44 mL). General instructions Schedule regular health, dental, and eye exams. Stay current with your vaccines. Tell your health care provider if: You often feel depressed. You have ever been abused or do not feel safe at home. Summary Adopting a healthy lifestyle and getting preventive care are important in promoting health and wellness. Follow your health care provider's instructions about healthy diet, exercising, and getting tested or screened for diseases. Follow your health care provider's instructions on monitoring your cholesterol and blood pressure. This information is not intended to replace advice given to you by your health care provider. Make sure you discuss any questions you have with your healthcare provider. Document Revised: 02/01/2018 Document Reviewed: 02/01/2018 Elsevier Patient Education  2022 Elsevier Inc.   Edwina Barth, MD [facility]

## 2020-10-15 ENCOUNTER — Ambulatory Visit (INDEPENDENT_AMBULATORY_CARE_PROVIDER_SITE_OTHER): Payer: 59 | Admitting: Family Medicine

## 2020-10-15 ENCOUNTER — Other Ambulatory Visit: Payer: Self-pay

## 2020-10-15 ENCOUNTER — Other Ambulatory Visit (HOSPITAL_COMMUNITY)
Admission: RE | Admit: 2020-10-15 | Discharge: 2020-10-15 | Disposition: A | Discharge status: Home / Self Care | Payer: 59 | Source: Ambulatory Visit | Attending: Family Medicine | Admitting: Family Medicine

## 2020-10-15 ENCOUNTER — Encounter: Payer: Self-pay | Admitting: Family Medicine

## 2020-10-15 VITALS — BP 134/67 | HR 60 | Ht 61.0 in | Wt 162.0 lb

## 2020-10-15 DIAGNOSIS — Z1211 Encounter for screening for malignant neoplasm of colon: Secondary | ICD-10-CM | POA: Diagnosis not present

## 2020-10-15 DIAGNOSIS — N898 Other specified noninflammatory disorders of vagina: Secondary | ICD-10-CM | POA: Diagnosis not present

## 2020-10-15 DIAGNOSIS — Z01411 Encounter for gynecological examination (general) (routine) with abnormal findings: Secondary | ICD-10-CM

## 2020-10-15 NOTE — Progress Notes (Signed)
Subjective:     Karen Banks is a 60 y.o. female and is here for a comprehensive physical exam. The patient reports no problems. She has had a hysterectomy but has noticed a vaginal odor.  The following portions of the patient's history were reviewed and updated as appropriate: allergies, current medications, past family history, past medical history, past social history, past surgical history, and problem list.  Review of Systems Pertinent items are noted in HPI.   Objective:    BP 134/67   Pulse 60   Ht 5\' 1"  (1.549 m)   Wt 162 lb (73.5 kg)   BMI 30.61 kg/m  General appearance: alert, cooperative, and appears stated age Head: Normocephalic, without obvious abnormality, atraumatic Neck: no adenopathy, supple, symmetrical, trachea midline, and thyroid not enlarged, symmetric, no tenderness/mass/nodules Lungs: clear to auscultation bilaterally Breasts: normal appearance, no masses or tenderness Heart: regular rate and rhythm, S1, S2 normal, no murmur, click, rub or gallop Abdomen: soft, non-tender; bowel sounds normal; no masses,  no organomegaly Pelvic: external genitalia normal, no adnexal masses or tenderness, uterus surgically absent, and vagina normal without discharge Extremities: extremities normal, atraumatic, no cyanosis or edema Pulses: 2+ and symmetric Skin: Skin color, texture, turgor normal. No rashes or lesions Lymph nodes: Cervical, supraclavicular, and axillary nodes normal. Neurologic: Grossly normal    Assessment:    Healthy female exam.      Plan:    Screen for colon cancer - GI referral for Coloscopy - Plan: Ambulatory referral to Gastroenterology  Vaginal odor - check wet prep - Plan: Cervicovaginal ancillary only( [facility])  Encounter for gynecological examination with abnormal finding - mammogram 06/27/2019 Return in 1 year (on 10/15/2021).  See After Visit Summary for Counseling Recommendations

## 2020-10-17 LAB — CERVICOVAGINAL ANCILLARY ONLY
Bacterial Vaginitis (gardnerella): NEGATIVE
Candida Glabrata: NEGATIVE
Candida Vaginitis: NEGATIVE
Comment: NEGATIVE
Comment: NEGATIVE
Comment: NEGATIVE

## 2021-03-12 ENCOUNTER — Ambulatory Visit: Payer: 59 | Admitting: Emergency Medicine

## 2022-02-04 IMAGING — MG MM DIGITAL SCREENING BILAT W/ TOMO AND CAD
8 series · 8 of 24 positions shown · non-contrast
Comparison: None.

CLINICAL DATA: Screening.

EXAM:
DIGITAL SCREENING BILATERAL MAMMOGRAM WITH TOMOSYNTHESIS AND CAD
TECHNIQUE: Bilateral screening digital craniocaudal and mediolateral oblique
mammograms were obtained. Bilateral screening digital breast
tomosynthesis was performed. The images were evaluated with
computer-aided detection.

[L CC synth-2D]
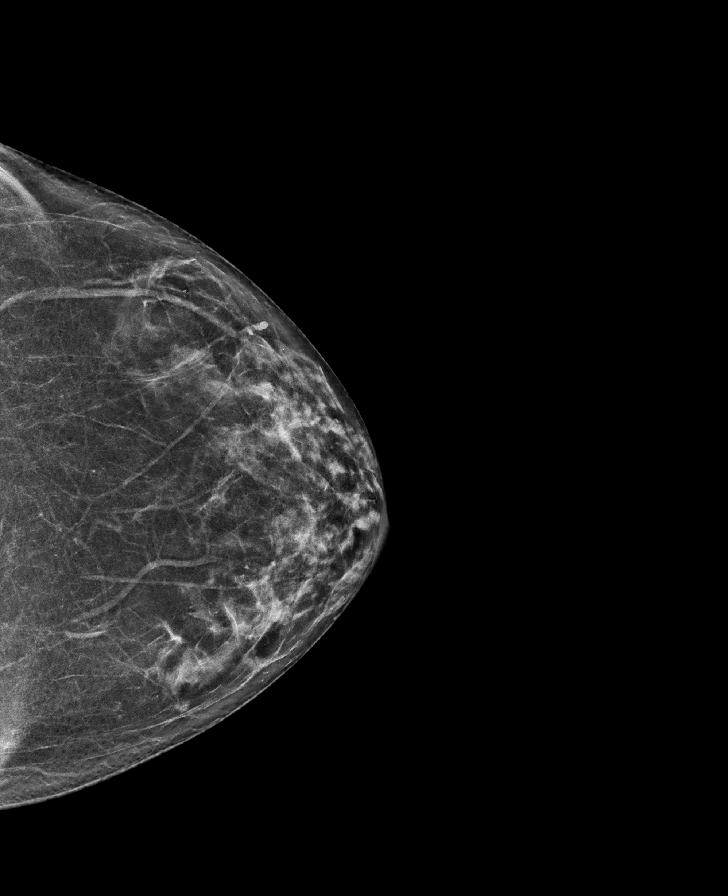

[L MLO synth-2D]
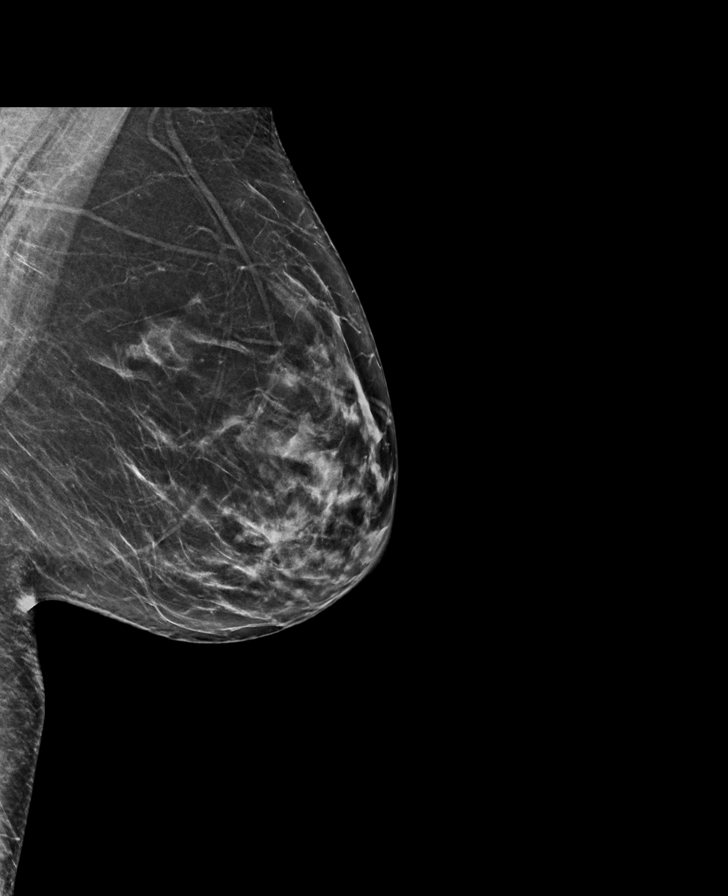

[R CC synth-2D]
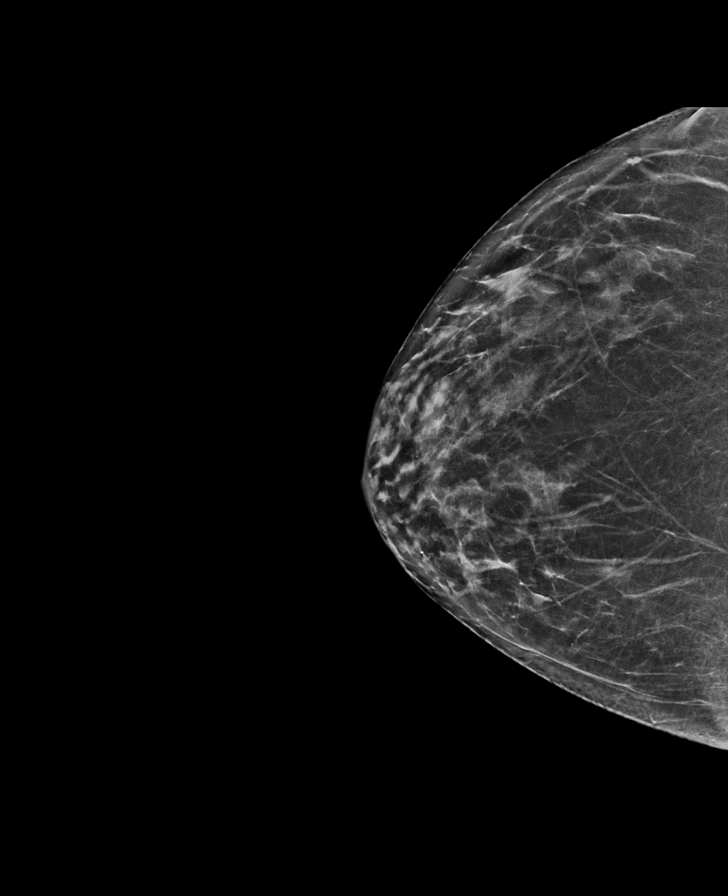

[R MLO synth-2D]
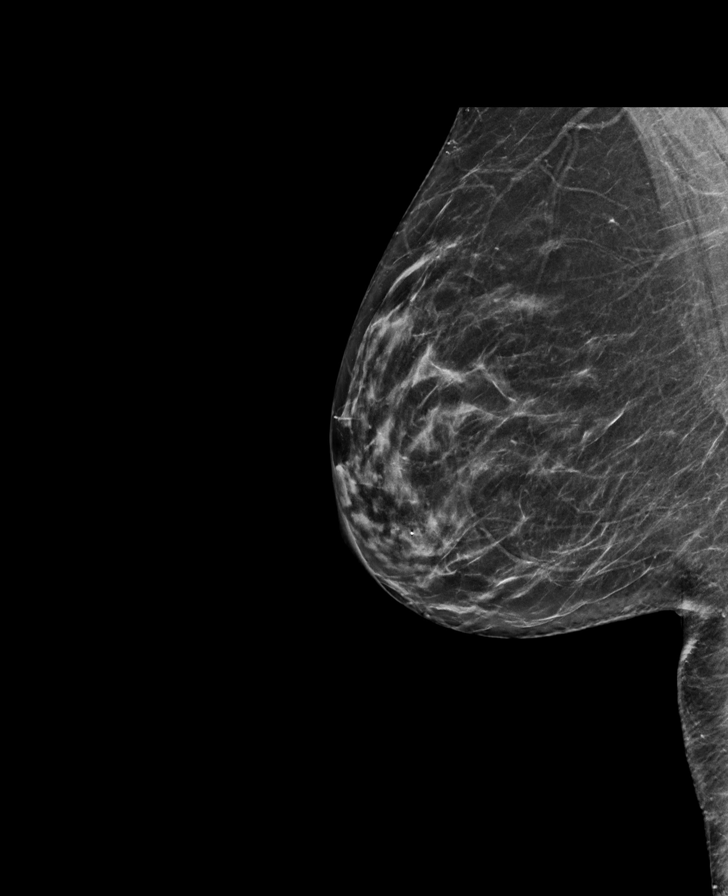

[R CC tomo · tomo slice 34/67.0]
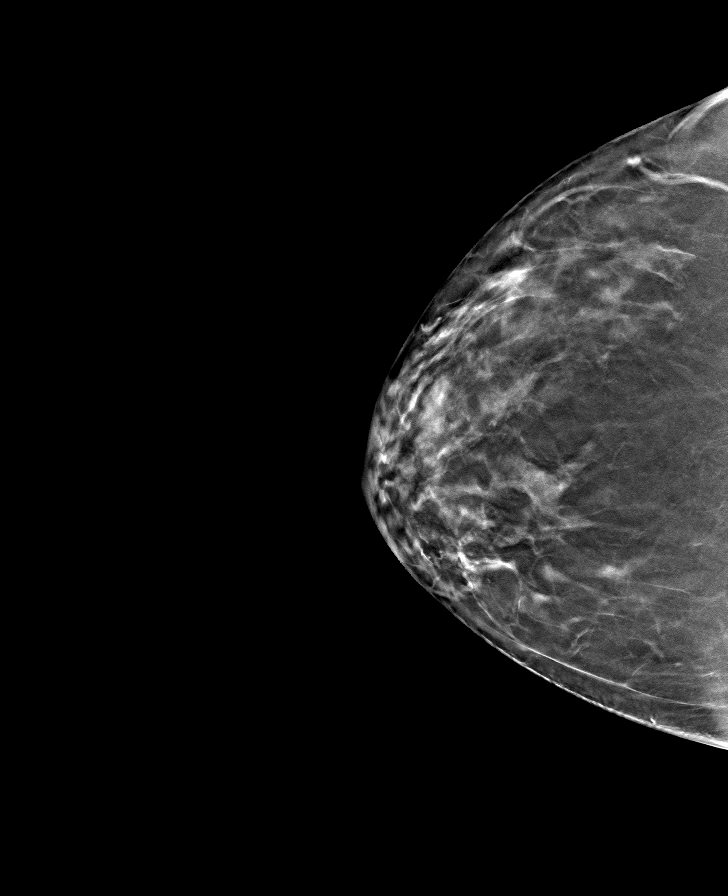

[L MLO tomo · tomo slice 37/72.0]
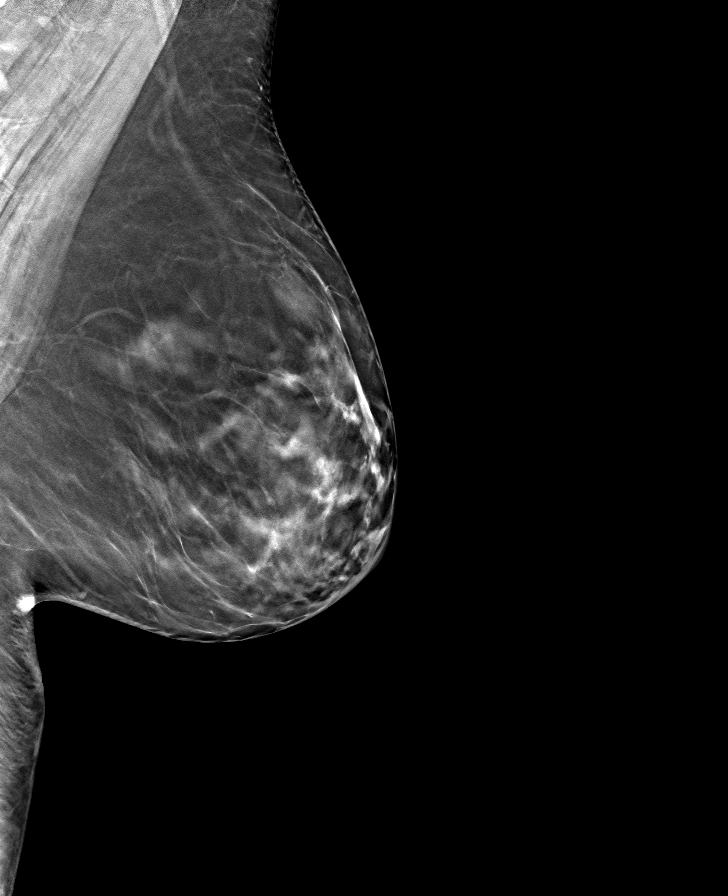

[R MLO tomo · tomo slice 35/70.0]
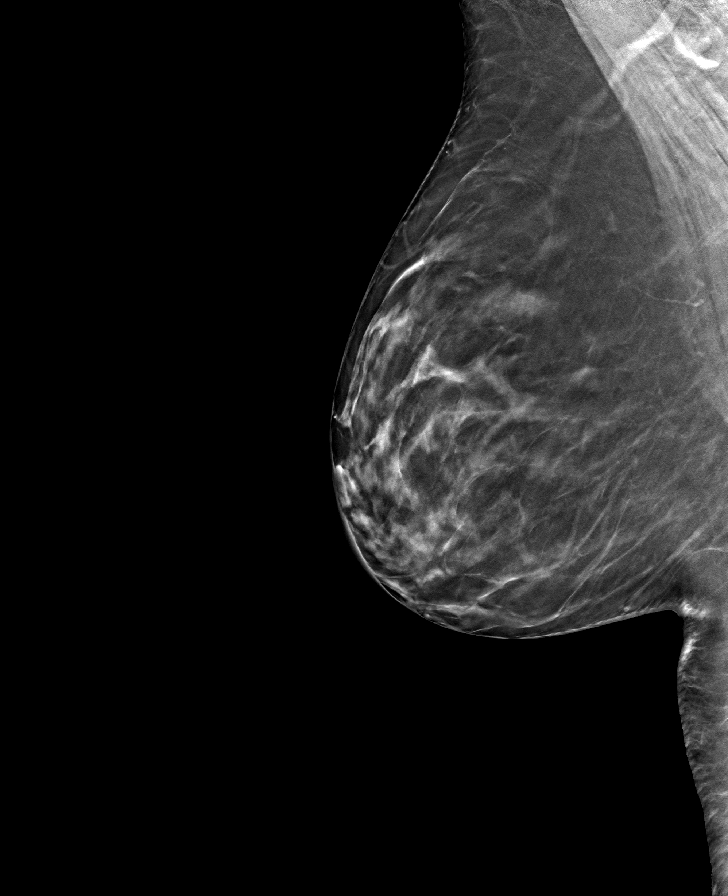

[L CC tomo · tomo slice 35/68.0]
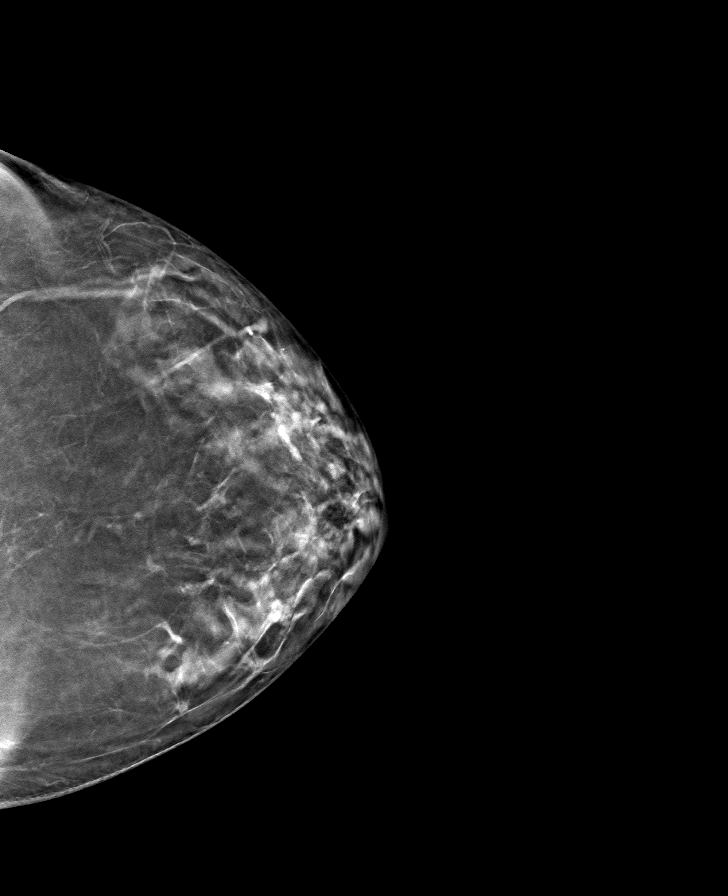

[8 of 24 positions shown; findings below may reference images not displayed]

ACR Breast Density Category c: The breast tissue is heterogeneously
dense, which may obscure small masses
FINDINGS: There are no findings suspicious for malignancy. The images were
evaluated with computer-aided detection.
IMPRESSION: No mammographic evidence of malignancy. A result letter of this
screening mammogram will be mailed directly to the patient.

RECOMMENDATION:
Screening mammogram in one year. (Code:CQ-1-UDH)

BI-RADS CATEGORY  1: Negative.

## 2022-02-11 ENCOUNTER — Other Ambulatory Visit (HOSPITAL_BASED_OUTPATIENT_CLINIC_OR_DEPARTMENT_OTHER): Payer: Self-pay

## 2022-02-11 MED ORDER — OZEMPIC (0.25 OR 0.5 MG/DOSE) 2 MG/3ML ~~LOC~~ SOPN
0.2500 mg | PEN_INJECTOR | SUBCUTANEOUS | 11 refills | Status: AC
  Filled 2022-02-11: qty 3, 28d supply, fill #0
  Filled 2022-02-11: qty 3, 30d supply, fill #0
  Filled 2022-03-09: qty 3, 28d supply, fill #1
  Filled 2022-04-02: qty 3, 28d supply, fill #2
  Filled 2022-04-30 – 2022-05-03 (×2): qty 3, 28d supply, fill #3

## 2022-02-12 ENCOUNTER — Other Ambulatory Visit (HOSPITAL_BASED_OUTPATIENT_CLINIC_OR_DEPARTMENT_OTHER): Payer: Self-pay

## 2022-04-06 ENCOUNTER — Other Ambulatory Visit: Payer: Self-pay

## 2022-05-03 ENCOUNTER — Other Ambulatory Visit (HOSPITAL_BASED_OUTPATIENT_CLINIC_OR_DEPARTMENT_OTHER): Payer: Self-pay

## 2022-05-04 ENCOUNTER — Other Ambulatory Visit (HOSPITAL_BASED_OUTPATIENT_CLINIC_OR_DEPARTMENT_OTHER): Payer: Self-pay

## 2022-05-05 ENCOUNTER — Other Ambulatory Visit (HOSPITAL_BASED_OUTPATIENT_CLINIC_OR_DEPARTMENT_OTHER): Payer: Self-pay

## 2022-10-29 ENCOUNTER — Other Ambulatory Visit (HOSPITAL_BASED_OUTPATIENT_CLINIC_OR_DEPARTMENT_OTHER): Payer: Self-pay

## 2022-10-29 MED ORDER — LEVOTHYROXINE SODIUM 100 MCG PO TABS
100.0000 ug | ORAL_TABLET | Freq: Every day | ORAL | 11 refills | Status: AC
  Filled 2022-10-29 – 2022-11-01 (×2): qty 30, 30d supply, fill #0
  Filled 2022-12-02: qty 30, 30d supply, fill #1
  Filled 2022-12-03: qty 30, 30d supply, fill #0
  Filled 2023-01-07: qty 30, 30d supply, fill #1
  Filled 2023-02-07: qty 30, 30d supply, fill #2
  Filled 2023-03-10: qty 30, 30d supply, fill #3
  Filled 2023-05-26: qty 30, 30d supply, fill #4
  Filled 2023-06-24: qty 30, 30d supply, fill #5

## 2022-11-01 ENCOUNTER — Other Ambulatory Visit (HOSPITAL_BASED_OUTPATIENT_CLINIC_OR_DEPARTMENT_OTHER): Payer: Self-pay

## 2022-11-01 ENCOUNTER — Other Ambulatory Visit: Payer: Self-pay

## 2022-12-03 ENCOUNTER — Other Ambulatory Visit (HOSPITAL_BASED_OUTPATIENT_CLINIC_OR_DEPARTMENT_OTHER): Payer: Self-pay

## 2023-01-31 ENCOUNTER — Encounter: Payer: Self-pay | Admitting: Emergency Medicine

## 2023-01-31 NOTE — Telephone Encounter (Signed)
 Care team updated and letter sent for eye exam notes.

## 2023-02-09 ENCOUNTER — Other Ambulatory Visit (HOSPITAL_BASED_OUTPATIENT_CLINIC_OR_DEPARTMENT_OTHER): Payer: Self-pay

## 2023-03-10 ENCOUNTER — Other Ambulatory Visit (HOSPITAL_BASED_OUTPATIENT_CLINIC_OR_DEPARTMENT_OTHER): Payer: Self-pay

## 2023-03-24 ENCOUNTER — Encounter (HOSPITAL_BASED_OUTPATIENT_CLINIC_OR_DEPARTMENT_OTHER): Payer: Self-pay | Admitting: Emergency Medicine

## 2023-03-24 ENCOUNTER — Emergency Department (HOSPITAL_BASED_OUTPATIENT_CLINIC_OR_DEPARTMENT_OTHER): Payer: 59

## 2023-03-24 ENCOUNTER — Emergency Department (HOSPITAL_BASED_OUTPATIENT_CLINIC_OR_DEPARTMENT_OTHER)
Admission: EM | Admit: 2023-03-24 | Discharge: 2023-03-24 | Disposition: A | Discharge status: Home / Self Care | Payer: 59 | Attending: Emergency Medicine | Admitting: Emergency Medicine

## 2023-03-24 ENCOUNTER — Other Ambulatory Visit: Payer: Self-pay

## 2023-03-24 DIAGNOSIS — E11649 Type 2 diabetes mellitus with hypoglycemia without coma: Secondary | ICD-10-CM | POA: Insufficient documentation

## 2023-03-24 DIAGNOSIS — Z794 Long term (current) use of insulin: Secondary | ICD-10-CM | POA: Insufficient documentation

## 2023-03-24 DIAGNOSIS — Z79899 Other long term (current) drug therapy: Secondary | ICD-10-CM | POA: Diagnosis not present

## 2023-03-24 DIAGNOSIS — R29898 Other symptoms and signs involving the musculoskeletal system: Secondary | ICD-10-CM

## 2023-03-24 DIAGNOSIS — E039 Hypothyroidism, unspecified: Secondary | ICD-10-CM | POA: Diagnosis not present

## 2023-03-24 DIAGNOSIS — R531 Weakness: Secondary | ICD-10-CM | POA: Diagnosis present

## 2023-03-24 DIAGNOSIS — E162 Hypoglycemia, unspecified: Secondary | ICD-10-CM

## 2023-03-24 LAB — URINALYSIS, W/ REFLEX TO CULTURE (INFECTION SUSPECTED)
Bilirubin Urine: NEGATIVE
Glucose, UA: >=500 mg/dL — AB
Hgb urine dipstick: NEGATIVE
Ketones, ur: 15 mg/dL — AB
Leukocytes,Ua: NEGATIVE
Nitrite: NEGATIVE
Protein, ur: NEGATIVE mg/dL
Specific Gravity, Urine: <=1.005 (ref 1.005–1.030)
pH: 6 (ref 5.0–8.0)

## 2023-03-24 LAB — COMPREHENSIVE METABOLIC PANEL
ALT: 16 U/L (ref 0–44)
AST: 27 U/L (ref 15–41)
Albumin: 4.6 g/dL (ref 3.5–5.0)
Alkaline Phosphatase: 76 U/L (ref 38–126)
Anion gap: 12 (ref 5–15)
BUN: 10 mg/dL (ref 8–23)
CO2: 22 mmol/L (ref 22–32)
Calcium: 9 mg/dL (ref 8.9–10.3)
Chloride: 103 mmol/L (ref 98–111)
Creatinine, Ser: 0.56 mg/dL (ref 0.44–1.00)
GFR, Estimated: >60 mL/min (ref >60)
Glucose, Bld: 44 mg/dL — CL (ref 70–99)
Potassium: 3.5 mmol/L (ref 3.5–5.1)
Sodium: 137 mmol/L (ref 135–145)
Total Bilirubin: 0.7 mg/dL (ref 0.0–1.2)
Total Protein: 7.7 g/dL (ref 6.5–8.1)

## 2023-03-24 LAB — CBC WITH DIFFERENTIAL/PLATELET
Abs Immature Granulocytes: 0.03 10*3/uL (ref 0.00–0.07)
Basophils Absolute: 0 10*3/uL (ref 0.0–0.1)
Basophils Relative: 0 %
Eosinophils Absolute: 0 10*3/uL (ref 0.0–0.5)
Eosinophils Relative: 0 %
HCT: 41.2 % (ref 36.0–46.0)
Hemoglobin: 14 g/dL (ref 12.0–15.0)
Immature Granulocytes: 1 %
Lymphocytes Relative: 14 %
Lymphs Abs: 0.8 10*3/uL (ref 0.7–4.0)
MCH: 33.7 pg (ref 26.0–34.0)
MCHC: 34 g/dL (ref 30.0–36.0)
MCV: 99.3 fL (ref 80.0–100.0)
Monocytes Absolute: 0.4 10*3/uL (ref 0.1–1.0)
Monocytes Relative: 8 %
Neutro Abs: 4.4 10*3/uL (ref 1.7–7.7)
Neutrophils Relative %: 77 %
Platelets: 258 10*3/uL (ref 150–400)
RBC: 4.15 MIL/uL (ref 3.87–5.11)
RDW: 12.8 % (ref 11.5–15.5)
WBC: 5.6 10*3/uL (ref 4.0–10.5)
nRBC: 0 % (ref 0.0–0.2)

## 2023-03-24 LAB — CBG MONITORING, ED
Glucose-Capillary: 195 mg/dL — ABNORMAL HIGH (ref 70–99)
Glucose-Capillary: 29 mg/dL — CL (ref 70–99)
Glucose-Capillary: 237 mg/dL — ABNORMAL HIGH (ref 70–99)

## 2023-03-24 LAB — TSH: TSH: 8.149 u[IU]/mL — ABNORMAL HIGH (ref 0.350–4.500)

## 2023-03-24 LAB — LIPASE, BLOOD: Lipase: 26 U/L (ref 11–51)

## 2023-03-24 MED ORDER — DEXTROSE 50 % IV SOLN: 1.0000 | Freq: Once | INTRAVENOUS | Status: AC

## 2023-03-24 MED ORDER — DEXTROSE 50 % IV SOLN
INTRAVENOUS | Status: AC
  Administered 2023-03-24: 50 mL via INTRAVENOUS
  Filled 2023-03-24: qty 50

## 2023-03-24 NOTE — ED Notes (Addendum)
Pt requested help getting to the bathroom. She refused to allow me to assist her because I'm female. Requested assistance from female RN.

## 2023-03-24 NOTE — ED Notes (Signed)
Cannot discharge, registration in chart.

## 2023-03-24 NOTE — ED Notes (Signed)
Patient CBG is 29. RN and Dr Rush Landmark notified.

## 2023-03-24 NOTE — ED Notes (Signed)
Patient able to stand and pivot to bedside commode with standby assistance.

## 2023-03-24 NOTE — ED Notes (Signed)
Discharge paperwork reviewed entirely with patient, including follow up care. Pain was under control. No prescriptions were called in, but all questions were addressed.  Pt verbalized understanding as well as all parties involved. No questions or concerns voiced at the time of discharge. No acute distress noted.   Pt ambulated out to PVA without incident or assistance.  Pt advised they will seek followup care with a specialist and followup with their PCP.

## 2023-03-24 NOTE — ED Notes (Signed)
Pt alert and oriented , low CBG. Orange juices given . MD at bedside . D 50 in route

## 2023-03-24 NOTE — ED Triage Notes (Signed)
Pt reports woke at 3 am this morning , was unsteady and weakness to lower extremities . Last seen normal 10 pm last night .  Hx tenontitis and type diabetes . Alert and oriented x 4 . Assisted from wheelchair to bed .  Marland Kitchen

## 2023-03-24 NOTE — Discharge Instructions (Signed)
Your history, exam, workup today revealed low glucose is the likely cause of your transient neurologic symptoms.  After fixing that, your symptoms resolved.  We observed for several hours with improvement in symptoms and no further hypoglycemia.  Given the conversation I had with your diabetes coordinator, we feel you are safe for discharge home to make modification to your regimen and changes per your PCP.  Please call them for close follow-up and rest and stay hydrated.  If any symptoms change or worsen acutely, please return to the nearest emergency department.

## 2023-03-24 NOTE — Inpatient Diabetes Management (Signed)
Inpatient Diabetes Program Recommendations  AACE/ADA: New Consensus Statement on Inpatient Glycemic Control (2015)  Target Ranges:  Prepandial:   less than 140 mg/dL      Peak postprandial:   less than 180 mg/dL (1-2 hours)      Critically ill patients:  140 - 180 mg/dL   Lab Results  Component Value Date   GLUCAP 237 (H) 03/24/2023   HGBA1C 8.5 (H) 08/24/2018    Discharge Recommendations: Long acting recommendations: Insulin Degludec (TRESIBA) FlexTouch Pen 15 units QD  Short acting recommendations:  Correction coverage ONLY Insulin aspart (NOVOLOG) FlexPen  Sensitive Scale.     Use Adult Diabetes Insulin Treatment Post Discharge order set.  Review of Glycemic Control  Latest Reference Range & Units 03/24/23 07:33 03/24/23 08:20 03/24/23 10:36  Glucose-Capillary 70 - 99 mg/dL 29 (LL) 130 (H) 865 (H)  (LL): Data is critically low (H): Data is abnormally high  Diabetes history: DM2 Outpatient Diabetes medications:  Tresiba 22-25 units every day at noon Novolog 1 units as needed Ozempic 0.25 mg weekly  Spoke with patient on the phone regarding hypoglycemia.  She confirms above home medications.  She states she usually feels low with symptoms of sweating, however this time was different.  She felt weak and thought something else was happening such as a stroke.  She last had an episode of hypoglycemia 2 weeks ago.  She rarely takes her short acting insulin and she adjusts her long acting insulin depending on what she eats.  She stopped her Ozempic in October and started back last Saturday.  Explained she may need to reduce her Tresiba/long acting insulin to 15 units every day.  Long acting insulin should not be used to cover carbohydrates.  This increases risks of hypoglycemia.  She should reduce her [place] and correct/cover carbs with Novolog/rapid insulin.    She sees her endocrinologist, Dr. Sharl Ma on 04/08/23.  Asked her to discuss a Freestyle Libre 3 CGM.  Explained how the CGM  works and that it has alarms for hypoglycemia to keep her safe.  She states he has talked to her  doctor about this and she is afraid to use one.  Explained exactly how it works and there is no commitment if she does not like it, she does not have to use it but it's worth a try if her insurance will cover it.  She states she will talk to Dr. Sharl Ma about this.    Please consider reducing Tresiba  to 15 units every day Novolog 0-9 units TID.  Will continue to follow while inpatient.  Thank you, Dulce Sellar, MSN, CDCES Diabetes Coordinator Inpatient Diabetes Program [phone] (team pager from 8a-5p)

## 2023-03-24 NOTE — ED Provider Notes (Signed)
EMERGENCY DEPARTMENT AT [facility] Provider Note   CSN: [id number] Arrival date & time: 03/24/23  0710     History  Chief Complaint  Patient presents with   Weakness    Karen Banks is a 63 y.o. female.  The history is provided by the patient and medical records. No language interpreter was used.  Weakness Severity:  Moderate Onset quality:  Gradual Duration:  5 hours Timing:  Constant Progression:  Worsening Chronicity:  New Context: not recent infection   Relieved by:  Nothing Worsened by:  Nothing Ineffective treatments:  None tried Associated symptoms: difficulty walking and sensory-motor deficit   Associated symptoms: no abdominal pain, no aphasia, no chest pain, no cough, no diarrhea, no dizziness, no dysuria, no numbness in extremities, no falls, no fever, no foul-smelling urine, no frequency, no headaches, no lethargy, no loss of consciousness, no myalgias, no nausea, no near-syncope, no seizures, no shortness of breath, no syncope, no vision change and no vomiting   Risk factors: diabetes        Home Medications Prior to Admission medications   Medication Sig Start Date End Date Taking? Authorizing Provider  levothyroxine (SYNTHROID) 100 MCG tablet Take 1 tablet (100 mcg total) by mouth daily in the morning on an empty stomach 10/29/22     levothyroxine (SYNTHROID) 112 MCG tablet Take 112 mcg by mouth daily before breakfast.  12/03/16   [provider]  NOVOLOG MIX 70/30 FLEXPEN (70-30) 100 UNIT/ML FlexPen Inject 18-20 Units into the skin See admin instructions. Take 18 units with breakfast and 20 units with supper 03/07/17   [provider]  omeprazole (PRILOSEC) 40 MG capsule Take 40 mg by mouth daily.    [provider]  Semaglutide,0.25 or 0.5MG /DOS, (OZEMPIC, 0.25 OR 0.5 MG/DOSE,) 2 MG/3ML SOPN Inject 0.25 mg into the skin once a week. 02/11/22         Allergies    Patient has no known allergies.     Review of Systems   Review of Systems  Constitutional:  Negative for chills, fatigue and fever.  HENT:  Negative for congestion.   Eyes:  Negative for visual disturbance.  Respiratory:  Negative for cough, chest tightness and shortness of breath.   Cardiovascular:  Negative for chest pain, syncope and near-syncope.  Gastrointestinal:  Negative for abdominal pain, constipation, diarrhea, nausea and vomiting.  Genitourinary:  Negative for dysuria, flank pain and frequency.  Musculoskeletal:  Negative for back pain, falls and myalgias.  Neurological:  Positive for weakness. Negative for dizziness, seizures, loss of consciousness, speech difficulty, light-headedness, numbness and headaches.  Psychiatric/Behavioral:  Negative for agitation.   All other systems reviewed and are negative.   Physical Exam Updated Vital Signs BP (!) 158/70   Pulse 72   Temp 97.7 F (36.5 C)   Resp 18   Wt 62.1 kg   SpO2 100%   BMI 25.89 kg/m  Physical Exam Vitals and nursing note reviewed.  Constitutional:      General: She is not in acute distress.    Appearance: She is well-developed. She is not ill-appearing, toxic-appearing or diaphoretic.  HENT:     Head: Normocephalic and atraumatic.     Nose: No congestion or rhinorrhea.     Mouth/Throat:     Mouth: Mucous membranes are moist.     Pharynx: No oropharyngeal exudate or posterior oropharyngeal erythema.  Eyes:     Extraocular Movements: Extraocular movements intact.     Conjunctiva/sclera: Conjunctivae normal.  Pupils: Pupils are equal, round, and reactive to light.  Cardiovascular:     Rate and Rhythm: Normal rate and regular rhythm.     Pulses: Normal pulses.     Heart sounds: No murmur heard. Pulmonary:     Effort: Pulmonary effort is normal. No respiratory distress.     Breath sounds: Normal breath sounds. No wheezing, rhonchi or rales.  Chest:     Chest wall: No tenderness.  Abdominal:     General: Abdomen is flat.      Palpations: Abdomen is soft.     Tenderness: There is no abdominal tenderness. There is no guarding or rebound.  Musculoskeletal:        General: No swelling or tenderness.     Cervical back: Neck supple.     Right lower leg: No edema.     Left lower leg: No edema.  Skin:    General: Skin is warm and dry.     Capillary Refill: Capillary refill takes less than 2 seconds.     Findings: No erythema.  Neurological:     General: No focal deficit present.     Mental Status: She is alert. Mental status is at baseline.     Sensory: No sensory deficit.     Motor: No weakness.     Coordination: Coordination normal.  Psychiatric:        Mood and Affect: Mood normal.     ED Results / Procedures / Treatments   Labs (all labs ordered are listed, but only abnormal results are displayed) Labs Reviewed  COMPREHENSIVE METABOLIC PANEL - Abnormal; Notable for the following components:      Result Value   Glucose, Bld 44 (*)    All other components within normal limits  URINALYSIS, W/ REFLEX TO CULTURE (INFECTION SUSPECTED) - Abnormal; Notable for the following components:   Glucose, UA >=500 (*)    Ketones, ur 15 (*)    Bacteria, UA RARE (*)    All other components within normal limits  CBG MONITORING, ED - Abnormal; Notable for the following components:   Glucose-Capillary 29 (*)    All other components within normal limits  CBG MONITORING, ED - Abnormal; Notable for the following components:   Glucose-Capillary 195 (*)    All other components within normal limits  CBG MONITORING, ED - Abnormal; Notable for the following components:   Glucose-Capillary 237 (*)    All other components within normal limits  CBC WITH DIFFERENTIAL/PLATELET  LIPASE, BLOOD  TSH    EKG EKG Interpretation Date/Time:  Thursday March 24 2023 07:23:30 EST Ventricular Rate:  69 PR Interval:  152 QRS Duration:  99 QT Interval:  420 QTC Calculation: 450 R Axis:   67  Text Interpretation: Sinus rhythm  Borderline T wave abnormalities when compared to prior. overall similar appearance. No STEMI Confirmed by Theda Belfast ([id number]) on 03/24/2023 7:32:36 AM  Radiology DG Chest Portable 1 View Result Date: 03/24/2023 CLINICAL DATA:  Hypoglycemia. EXAM: PORTABLE CHEST 1 VIEW COMPARISON:  None Available. FINDINGS: Artifact from EKG leads. Normal heart size and mediastinal contours. No acute infiltrate or edema. No effusion or pneumothorax. No acute osseous findings. IMPRESSION: No evidence of active disease. Electronically Signed   By: Tiburcio Pea M.D.   On: 03/24/2023 08:08    Procedures Procedures    CRITICAL CARE Performed by: Canary Brim Ravyn Nikkel Total critical care time: 30 minutes Critical care time was exclusive of separately billable procedures and treating other patients. Critical care was  necessary to treat or prevent imminent or life-threatening deterioration. Critical care was time spent personally by me on the following activities: development of treatment plan with patient and/or surrogate as well as nursing, discussions with consultants, evaluation of patient's response to treatment, examination of patient, obtaining history from patient or surrogate, ordering and performing treatments and interventions, ordering and review of laboratory studies, ordering and review of radiographic studies, pulse oximetry and re-evaluation of patient's condition.   Medications Ordered in ED Medications  dextrose 50 % solution 50 mL (50 mLs Intravenous Given 03/24/23 0740)    ED Course/ Medical Decision Making/ A&P                                 Medical Decision Making Amount and/or Complexity of Data Reviewed Labs: ordered. Radiology: ordered.  Risk Prescription drug management.    Lajeana Strough is a 63 y.o. female with a past medical history significant for hypothyroidism, previous cholecystectomy, and diabetes who presents with weakness and gait instability.  According to  patient, she has had labile glucose readings overnight for some time sometimes going down into the 50s requiring her to get some banana chips or some glucose in her bedside to improve it.  She usually recovers without any difficulty.  She said that she is even seen going to the 20s in the past but not recently.  She says that before going to bed last night at 10 PM she was at her baseline and had no recent fevers, chills, congestion, cough, nausea, vomiting, constipation, diarrhea, urinary changes or URI symptoms.  She denies any palpitations, chest pain, shortness of breath.  She says she woke about 3 AM and was feeling off and feeling some unsteadiness when she got back from the bathroom.  She reports this morning she did not want to get out of bed because she was feeling too weak and was feeling weakness in both her legs and both her arms.  She did not describe any numbness however.  She denies any vision changes or speech abnormalities.  Patient was brought in by family due to her having weakness that was new.  On arrival, patient found have a glucose in the 20s.  Patient was given juice and D50 and by the time I started to assess her her symptoms had resolved with no weakness in legs or arms.    She on exam had no numbness tingling or weakness of extremities.  Symmetric smile.  Clear speech.  Pupils symmetric and reactive with normal extract movements.  I did not appreciate a carotid bruit.  Chest abdomen nontender.  No murmur.  Good bowel sounds.  Good pulses in extremities.  Patient resting comfortably now after the D50 and juice.  Patient says that she did not take her short acting insulin yesterday and only took her long-acting insulin around noon yesterday as she normally does.  She has no recent medication changes but says that she does not eat very much food.  She said that last night she ate fish and salad at a normal amount and thought that her glucose would be normal.  Clinically I suspect  that the patient symptoms related to the hypoglycemia that likely has improved.  Will get blood work now and check a glucose and a little bit to make sure it is improving.  Given her resolution with improvement in the glucose I have low suspicion for stroke at this time.  With her reporting that her glucose goes into the 50s almost every night and even into the 20s at times I am concerned about her glucose regimen.  Patient says that due to being more labile she was not taking her short acting with meals because she was not eating much.  Will get screening labs to look for electrolyte disturbance, occult infection with her urine or chest x-ray, and will make sure her thyroid is not significantly off.  EKG did not show STEMI.  Anticipate discharge assessment conversation about disposition after workup is completed and if patient becomes more hyperglycemic again, patient may require admission.  12:13 PM Glucose improved.  Rest of labs overall reassuring.  No evidence of kidney or liver dysfunction at this time.  CBC reassuring.  Lipase normal.  Urinalysis did not show UTI with no nitrites or leukocytes.  X-ray does show pneumonia.  Patient symptoms remain resolved after the glucose improved.  Patient spoke to the diabetes coordinator and I spoke with her through secure chat and they feel patient is safe for discharge home to make some changes with her medications that they directed her about.  She will call her PCP for further management.  Patient and other questions or concerns and was discharged in good condition.         Final Clinical Impression(s) / ED Diagnoses Final diagnoses:  Hypoglycemia  Transient leg weakness    Rx / DC Orders ED Discharge Orders     None        Clinical Impression: 1. Hypoglycemia   2. Transient leg weakness     Disposition: Discharge  Condition: Good  I have discussed the results, Dx and Tx plan with the pt(& family if present). He/she/they  expressed understanding and agree(s) with the plan. Discharge instructions discussed at great length. Strict return precautions discussed and pt &/or family have verbalized understanding of the instructions. No further questions at time of discharge.    New Prescriptions   No medications on file    Follow Up: Georgina Quint, MD [address] [place] [address] [phone]     [facility] Emergency Department at [facility] [address] [place] [address] [phone]       Herold Salguero, Canary Brim, MD 03/24/23 [phone]

## 2023-06-27 ENCOUNTER — Other Ambulatory Visit (HOSPITAL_BASED_OUTPATIENT_CLINIC_OR_DEPARTMENT_OTHER): Payer: Self-pay

## 2023-06-27 MED ORDER — LEVOTHYROXINE SODIUM 100 MCG PO TABS
100.0000 ug | ORAL_TABLET | Freq: Every morning | ORAL | 4 refills | Status: AC
  Filled 2023-06-27 – 2023-07-22 (×2): qty 30, 30d supply, fill #0
  Filled 2023-09-03: qty 30, 30d supply, fill #1

## 2023-07-22 ENCOUNTER — Other Ambulatory Visit (HOSPITAL_BASED_OUTPATIENT_CLINIC_OR_DEPARTMENT_OTHER): Payer: Self-pay

## 2023-10-12 ENCOUNTER — Other Ambulatory Visit: Payer: Self-pay | Admitting: Internal Medicine

## 2023-10-12 DIAGNOSIS — Z1231 Encounter for screening mammogram for malignant neoplasm of breast: Secondary | ICD-10-CM

## 2023-10-31 ENCOUNTER — Ambulatory Visit
Admission: RE | Admit: 2023-10-31 | Discharge: 2023-10-31 | Disposition: A | Discharge status: Home / Self Care | Source: Ambulatory Visit | Attending: Internal Medicine | Admitting: Internal Medicine

## 2023-10-31 DIAGNOSIS — Z1231 Encounter for screening mammogram for malignant neoplasm of breast: Secondary | ICD-10-CM
